# Patient Record
Sex: Male | Born: 1935 | Race: Black or African American | Hispanic: No | Marital: Single
Health system: Southern US, Community
[De-identification: ages and names within clinical notes are randomized; demographics above are authoritative.]

## PROBLEM LIST (undated history)

## (undated) DIAGNOSIS — I10 Essential (primary) hypertension: Secondary | ICD-10-CM

## (undated) DIAGNOSIS — F209 Schizophrenia, unspecified: Secondary | ICD-10-CM

## (undated) DIAGNOSIS — G309 Alzheimer's disease, unspecified: Secondary | ICD-10-CM

## (undated) DIAGNOSIS — F0280 Dementia in other diseases classified elsewhere without behavioral disturbance: Secondary | ICD-10-CM

---

## 2004-03-20 ENCOUNTER — Other Ambulatory Visit: Payer: Self-pay

## 2004-04-22 ENCOUNTER — Other Ambulatory Visit: Payer: Self-pay

## 2005-09-24 ENCOUNTER — Ambulatory Visit: Payer: Self-pay | Admitting: Radiation Oncology

## 2006-04-08 ENCOUNTER — Ambulatory Visit: Payer: Self-pay | Admitting: Radiation Oncology

## 2006-10-04 ENCOUNTER — Emergency Department: Payer: Self-pay | Admitting: Emergency Medicine

## 2006-10-04 ENCOUNTER — Other Ambulatory Visit: Payer: Self-pay

## 2007-11-07 ENCOUNTER — Emergency Department: Payer: Self-pay | Admitting: Internal Medicine

## 2008-12-05 ENCOUNTER — Ambulatory Visit: Payer: Self-pay | Admitting: Gastroenterology

## 2009-12-24 ENCOUNTER — Emergency Department: Payer: Self-pay | Admitting: Emergency Medicine

## 2010-12-18 ENCOUNTER — Emergency Department: Payer: Self-pay | Admitting: Emergency Medicine

## 2010-12-24 ENCOUNTER — Emergency Department: Payer: Self-pay | Admitting: Emergency Medicine

## 2011-01-10 ENCOUNTER — Ambulatory Visit: Payer: Self-pay | Admitting: Family Medicine

## 2011-03-17 ENCOUNTER — Emergency Department: Payer: Self-pay | Admitting: Unknown Physician Specialty

## 2011-06-29 ENCOUNTER — Ambulatory Visit: Payer: Self-pay | Admitting: Internal Medicine

## 2011-08-06 ENCOUNTER — Ambulatory Visit: Payer: Self-pay | Admitting: Internal Medicine

## 2011-08-07 LAB — PSA: PSA: 0.2 ng/mL (ref 0.0–4.0)

## 2011-08-29 ENCOUNTER — Ambulatory Visit: Payer: Self-pay | Admitting: Internal Medicine

## 2011-09-07 ENCOUNTER — Ambulatory Visit: Payer: Self-pay | Admitting: Unknown Physician Specialty

## 2011-09-14 ENCOUNTER — Ambulatory Visit: Payer: Self-pay | Admitting: Internal Medicine

## 2011-09-14 LAB — PATHOLOGY REPORT

## 2011-09-29 ENCOUNTER — Ambulatory Visit: Payer: Self-pay | Admitting: Internal Medicine

## 2011-10-01 ENCOUNTER — Emergency Department: Payer: Self-pay | Admitting: Emergency Medicine

## 2011-10-01 LAB — COMPREHENSIVE METABOLIC PANEL
Anion Gap: 7 (ref 7–16)
BUN: 22 mg/dL — ABNORMAL HIGH (ref 7–18)
Calcium, Total: 8.4 mg/dL — ABNORMAL LOW (ref 8.5–10.1)
Chloride: 106 mmol/L (ref 98–107)
Co2: 29 mmol/L (ref 21–32)
EGFR (African American): 50 — ABNORMAL LOW
EGFR (Non-African Amer.): 41 — ABNORMAL LOW
Glucose: 100 mg/dL — ABNORMAL HIGH (ref 65–99)
Potassium: 3.8 mmol/L (ref 3.5–5.1)
SGOT(AST): 28 U/L (ref 15–37)
SGPT (ALT): 24 U/L
Sodium: 142 mmol/L (ref 136–145)

## 2011-10-01 LAB — CBC
MCH: 31.8 pg (ref 26.0–34.0)
MCHC: 34.5 g/dL (ref 32.0–36.0)
Platelet: 176 10*3/uL (ref 150–440)
RBC: 3.9 10*6/uL — ABNORMAL LOW (ref 4.40–5.90)
WBC: 4.5 10*3/uL (ref 3.8–10.6)

## 2011-10-02 ENCOUNTER — Ambulatory Visit: Payer: Self-pay | Admitting: Internal Medicine

## 2011-10-14 LAB — CBC CANCER CENTER
Basophil %: 0.4 %
Eosinophil #: 0.4 x10 3/mm (ref 0.0–0.7)
HCT: 38.1 % — ABNORMAL LOW (ref 40.0–52.0)
HGB: 13 g/dL (ref 13.0–18.0)
Lymphocyte #: 1 x10 3/mm (ref 1.0–3.6)
Lymphocyte %: 13.6 %
MCH: 30.9 pg (ref 26.0–34.0)
MCV: 91 fL (ref 80–100)
Monocyte #: 0.6 x10 3/mm (ref 0.0–0.7)
Monocyte %: 8.1 %
Neutrophil %: 72.7 %
Platelet: 223 x10 3/mm (ref 150–440)
RBC: 4.2 10*6/uL — ABNORMAL LOW (ref 4.40–5.90)
WBC: 7.3 x10 3/mm (ref 3.8–10.6)

## 2011-10-15 LAB — KAPPA/LAMBDA FREE LIGHT CHAINS (ARMC)

## 2011-10-30 ENCOUNTER — Ambulatory Visit: Payer: Self-pay | Admitting: Internal Medicine

## 2012-07-08 ENCOUNTER — Emergency Department: Payer: Self-pay | Admitting: Emergency Medicine

## 2012-10-13 ENCOUNTER — Emergency Department: Payer: Self-pay | Admitting: Emergency Medicine

## 2012-10-13 LAB — COMPREHENSIVE METABOLIC PANEL
Anion Gap: 8 (ref 7–16)
Co2: 28 mmol/L (ref 21–32)
EGFR (Non-African Amer.): 37 — ABNORMAL LOW
Potassium: 4.2 mmol/L (ref 3.5–5.1)
SGOT(AST): 25 U/L (ref 15–37)
SGPT (ALT): 25 U/L (ref 12–78)
Sodium: 142 mmol/L (ref 136–145)

## 2012-10-13 LAB — URINALYSIS, COMPLETE
Bilirubin,UR: NEGATIVE
Blood: NEGATIVE
Ketone: NEGATIVE
Nitrite: NEGATIVE
Specific Gravity: 1.008 (ref 1.003–1.030)
Squamous Epithelial: NONE SEEN
WBC UR: <1 /HPF (ref 0–5)

## 2012-10-13 LAB — CBC
HCT: 35.2 % — ABNORMAL LOW (ref 40.0–52.0)
HGB: 11.7 g/dL — ABNORMAL LOW (ref 13.0–18.0)
MCV: 92 fL (ref 80–100)
Platelet: 175 10*3/uL (ref 150–440)
RBC: 3.83 10*6/uL — ABNORMAL LOW (ref 4.40–5.90)
RDW: 13.4 % (ref 11.5–14.5)
WBC: 4.2 10*3/uL (ref 3.8–10.6)

## 2012-10-13 LAB — PROTIME-INR
INR: 1.1
Prothrombin Time: 14.3 secs (ref 11.5–14.7)

## 2012-10-13 LAB — DRUG SCREEN, URINE
Barbiturates, Ur Screen: NEGATIVE (ref ?–200)
Benzodiazepine, Ur Scrn: NEGATIVE (ref ?–200)
Cannabinoid 50 Ng, Ur ~~LOC~~: NEGATIVE (ref ?–50)
Methadone, Ur Screen: NEGATIVE (ref ?–300)

## 2012-10-13 LAB — ETHANOL: Ethanol: <3 mg/dL

## 2012-10-13 LAB — TROPONIN I: Troponin-I: <0.02 ng/mL

## 2013-02-04 ENCOUNTER — Emergency Department: Payer: Self-pay | Admitting: Emergency Medicine

## 2014-09-07 ENCOUNTER — Inpatient Hospital Stay: Payer: Self-pay | Admitting: Internal Medicine

## 2014-09-07 LAB — COMPREHENSIVE METABOLIC PANEL
ALBUMIN: 3.4 g/dL (ref 3.4–5.0)
ALT: 28 U/L
Alkaline Phosphatase: 103 U/L
Anion Gap: 5 — ABNORMAL LOW (ref 7–16)
BUN: 30 mg/dL — ABNORMAL HIGH (ref 7–18)
Bilirubin,Total: 0.4 mg/dL (ref 0.2–1.0)
CHLORIDE: 110 mmol/L — AB (ref 98–107)
Calcium, Total: 9.2 mg/dL (ref 8.5–10.1)
Co2: 28 mmol/L (ref 21–32)
Creatinine: 2.23 mg/dL — ABNORMAL HIGH (ref 0.60–1.30)
EGFR (African American): 37 — ABNORMAL LOW
EGFR (Non-African Amer.): 30 — ABNORMAL LOW
Glucose: 127 mg/dL — ABNORMAL HIGH (ref 65–99)
Osmolality: 293 (ref 275–301)
Potassium: 5.4 mmol/L — ABNORMAL HIGH (ref 3.5–5.1)
SGOT(AST): 29 U/L (ref 15–37)
Sodium: 143 mmol/L (ref 136–145)
Total Protein: 7.4 g/dL (ref 6.4–8.2)

## 2014-09-07 LAB — CBC
HCT: 41.8 % (ref 40.0–52.0)
HGB: 13.6 g/dL (ref 13.0–18.0)
MCH: 30.1 pg (ref 26.0–34.0)
MCHC: 32.4 g/dL (ref 32.0–36.0)
MCV: 93 fL (ref 80–100)
Platelet: 255 10*3/uL (ref 150–440)
RBC: 4.51 10*6/uL (ref 4.40–5.90)
RDW: 14.3 % (ref 11.5–14.5)
WBC: 7 10*3/uL (ref 3.8–10.6)

## 2014-09-07 LAB — URINALYSIS, COMPLETE
Bilirubin,UR: NEGATIVE
Blood: NEGATIVE
Glucose,UR: NEGATIVE mg/dL (ref 0–75)
Hyaline Cast: 3
Ketone: NEGATIVE
Nitrite: NEGATIVE
Ph: 5 (ref 4.5–8.0)
Protein: NEGATIVE
SPECIFIC GRAVITY: 1.015 (ref 1.003–1.030)
WBC UR: 12 /HPF (ref 0–5)

## 2014-09-07 LAB — TROPONIN I: Troponin-I: <0.02 ng/mL

## 2014-09-08 LAB — CBC WITH DIFFERENTIAL/PLATELET
BASOS ABS: 0 10*3/uL (ref 0.0–0.1)
BASOS PCT: 0.7 %
EOS ABS: 0.1 10*3/uL (ref 0.0–0.7)
Eosinophil %: 0.7 %
HCT: 40.6 % (ref 40.0–52.0)
HGB: 13.2 g/dL (ref 13.0–18.0)
Lymphocyte #: 0.6 10*3/uL — ABNORMAL LOW (ref 1.0–3.6)
Lymphocyte %: 8 %
MCH: 30 pg (ref 26.0–34.0)
MCHC: 32.6 g/dL (ref 32.0–36.0)
MCV: 92 fL (ref 80–100)
MONO ABS: 0.8 x10 3/mm (ref 0.2–1.0)
Monocyte %: 10.5 %
Neutrophil #: 6 10*3/uL (ref 1.4–6.5)
Neutrophil %: 80.1 %
PLATELETS: 252 10*3/uL (ref 150–440)
RBC: 4.4 10*6/uL (ref 4.40–5.90)
RDW: 14 % (ref 11.5–14.5)
WBC: 7.5 10*3/uL (ref 3.8–10.6)

## 2014-09-08 LAB — BASIC METABOLIC PANEL
Anion Gap: 7 (ref 7–16)
BUN: 26 mg/dL — ABNORMAL HIGH (ref 7–18)
Calcium, Total: 9.2 mg/dL (ref 8.5–10.1)
Chloride: 110 mmol/L — ABNORMAL HIGH (ref 98–107)
Co2: 25 mmol/L (ref 21–32)
Creatinine: 1.84 mg/dL — ABNORMAL HIGH (ref 0.60–1.30)
EGFR (African American): 46 — ABNORMAL LOW
GFR CALC NON AF AMER: 38 — AB
Glucose: 104 mg/dL — ABNORMAL HIGH (ref 65–99)
Osmolality: 288 (ref 275–301)
Potassium: 4.6 mmol/L (ref 3.5–5.1)
SODIUM: 142 mmol/L (ref 136–145)

## 2014-09-09 LAB — CBC WITH DIFFERENTIAL/PLATELET
BASOS ABS: 0.1 10*3/uL (ref 0.0–0.1)
Basophil %: 0.7 %
EOS ABS: 0 10*3/uL (ref 0.0–0.7)
EOS PCT: 0.2 %
HCT: 43.1 % (ref 40.0–52.0)
HGB: 13.7 g/dL (ref 13.0–18.0)
LYMPHS PCT: 6.5 %
Lymphocyte #: 0.7 10*3/uL — ABNORMAL LOW (ref 1.0–3.6)
MCH: 29.6 pg (ref 26.0–34.0)
MCHC: 31.7 g/dL — ABNORMAL LOW (ref 32.0–36.0)
MCV: 93 fL (ref 80–100)
Monocyte #: 0.9 x10 3/mm (ref 0.2–1.0)
Monocyte %: 7.9 %
Neutrophil #: 9.6 10*3/uL — ABNORMAL HIGH (ref 1.4–6.5)
Neutrophil %: 84.7 %
Platelet: 266 10*3/uL (ref 150–440)
RBC: 4.61 10*6/uL (ref 4.40–5.90)
RDW: 13.7 % (ref 11.5–14.5)
WBC: 11.3 10*3/uL — AB (ref 3.8–10.6)

## 2014-09-09 LAB — BASIC METABOLIC PANEL
Anion Gap: 9 (ref 7–16)
BUN: 46 mg/dL — ABNORMAL HIGH (ref 7–18)
CALCIUM: 9.8 mg/dL (ref 8.5–10.1)
CO2: 27 mmol/L (ref 21–32)
CREATININE: 2.39 mg/dL — AB (ref 0.60–1.30)
Chloride: 106 mmol/L (ref 98–107)
EGFR (Non-African Amer.): 28 — ABNORMAL LOW
GFR CALC AF AMER: 34 — AB
Glucose: 153 mg/dL — ABNORMAL HIGH (ref 65–99)
Osmolality: 298 (ref 275–301)
Potassium: 5.3 mmol/L — ABNORMAL HIGH (ref 3.5–5.1)
Sodium: 142 mmol/L (ref 136–145)

## 2014-09-11 LAB — BASIC METABOLIC PANEL
ANION GAP: 9 (ref 7–16)
BUN: 35 mg/dL — ABNORMAL HIGH (ref 7–18)
CALCIUM: 8.6 mg/dL (ref 8.5–10.1)
CHLORIDE: 106 mmol/L (ref 98–107)
Co2: 27 mmol/L (ref 21–32)
Creatinine: 1.91 mg/dL — ABNORMAL HIGH (ref 0.60–1.30)
EGFR (African American): 44 — ABNORMAL LOW
EGFR (Non-African Amer.): 36 — ABNORMAL LOW
Glucose: 147 mg/dL — ABNORMAL HIGH (ref 65–99)
Osmolality: 294 (ref 275–301)
Potassium: 3.7 mmol/L (ref 3.5–5.1)
SODIUM: 142 mmol/L (ref 136–145)

## 2014-09-11 LAB — CBC WITH DIFFERENTIAL/PLATELET
BASOS ABS: 0.1 10*3/uL (ref 0.0–0.1)
Basophil %: 0.7 %
Eosinophil #: 0.2 10*3/uL (ref 0.0–0.7)
Eosinophil %: 2.3 %
HCT: 40 % (ref 40.0–52.0)
HGB: 12.8 g/dL — AB (ref 13.0–18.0)
LYMPHS ABS: 0.9 10*3/uL — AB (ref 1.0–3.6)
Lymphocyte %: 12.6 %
MCH: 29.8 pg (ref 26.0–34.0)
MCHC: 31.9 g/dL — AB (ref 32.0–36.0)
MCV: 94 fL (ref 80–100)
MONOS PCT: 11.5 %
Monocyte #: 0.8 x10 3/mm (ref 0.2–1.0)
Neutrophil #: 5.1 10*3/uL (ref 1.4–6.5)
Neutrophil %: 72.9 %
Platelet: 210 10*3/uL (ref 150–440)
RBC: 4.28 10*6/uL — ABNORMAL LOW (ref 4.40–5.90)
RDW: 14.1 % (ref 11.5–14.5)
WBC: 7 10*3/uL (ref 3.8–10.6)

## 2014-09-14 ENCOUNTER — Emergency Department: Payer: Self-pay | Admitting: Emergency Medicine

## 2014-09-14 LAB — COMPREHENSIVE METABOLIC PANEL
Albumin: 3 g/dL — ABNORMAL LOW (ref 3.4–5.0)
Alkaline Phosphatase: 98 U/L
Anion Gap: 7 (ref 7–16)
BUN: 28 mg/dL — ABNORMAL HIGH (ref 7–18)
Bilirubin,Total: 0.5 mg/dL (ref 0.2–1.0)
CHLORIDE: 111 mmol/L — AB (ref 98–107)
Calcium, Total: 8.5 mg/dL (ref 8.5–10.1)
Co2: 25 mmol/L (ref 21–32)
Creatinine: 1.99 mg/dL — ABNORMAL HIGH (ref 0.60–1.30)
EGFR (Non-African Amer.): 35 — ABNORMAL LOW
GFR CALC AF AMER: 42 — AB
GLUCOSE: 77 mg/dL (ref 65–99)
Osmolality: 289 (ref 275–301)
POTASSIUM: 3.8 mmol/L (ref 3.5–5.1)
SGOT(AST): 34 U/L (ref 15–37)
SGPT (ALT): 36 U/L
SODIUM: 143 mmol/L (ref 136–145)
Total Protein: 6.7 g/dL (ref 6.4–8.2)

## 2014-09-14 LAB — CBC
HCT: 37.8 % — AB (ref 40.0–52.0)
HGB: 12.1 g/dL — ABNORMAL LOW (ref 13.0–18.0)
MCH: 29.7 pg (ref 26.0–34.0)
MCHC: 32 g/dL (ref 32.0–36.0)
MCV: 93 fL (ref 80–100)
Platelet: 197 10*3/uL (ref 150–440)
RBC: 4.07 10*6/uL — ABNORMAL LOW (ref 4.40–5.90)
RDW: 13.8 % (ref 11.5–14.5)
WBC: 7.3 10*3/uL (ref 3.8–10.6)

## 2014-09-14 LAB — ETHANOL: Ethanol: <3 mg/dL

## 2014-09-14 LAB — DRUG SCREEN, URINE
AMPHETAMINES, UR SCREEN: NEGATIVE (ref ?–1000)
BARBITURATES, UR SCREEN: NEGATIVE (ref ?–200)
BENZODIAZEPINE, UR SCRN: NEGATIVE (ref ?–200)
COCAINE METABOLITE, UR ~~LOC~~: NEGATIVE (ref ?–300)
Cannabinoid 50 Ng, Ur ~~LOC~~: NEGATIVE (ref ?–50)
MDMA (ECSTASY) UR SCREEN: NEGATIVE (ref ?–500)
METHADONE, UR SCREEN: NEGATIVE (ref ?–300)
OPIATE, UR SCREEN: NEGATIVE (ref ?–300)
Phencyclidine (PCP) Ur S: NEGATIVE (ref ?–25)
TRICYCLIC, UR SCREEN: NEGATIVE (ref ?–1000)

## 2014-09-14 LAB — URINALYSIS, COMPLETE
BILIRUBIN, UR: NEGATIVE
BLOOD: NEGATIVE
Glucose,UR: NEGATIVE mg/dL (ref 0–75)
Ketone: NEGATIVE
LEUKOCYTE ESTERASE: NEGATIVE
NITRITE: NEGATIVE
PH: 5 (ref 4.5–8.0)
PROTEIN: NEGATIVE
RBC, UR: NONE SEEN /HPF (ref 0–5)
Specific Gravity: 1.006 (ref 1.003–1.030)
Squamous Epithelial: <1
WBC UR: 1 /HPF (ref 0–5)

## 2014-09-14 LAB — SALICYLATE LEVEL: Salicylates, Serum: <1.7 mg/dL

## 2014-09-14 LAB — TSH: THYROID STIMULATING HORM: 0.466 u[IU]/mL

## 2014-09-14 LAB — ACETAMINOPHEN LEVEL: Acetaminophen: <2 ug/mL

## 2014-09-23 ENCOUNTER — Emergency Department: Payer: Self-pay | Admitting: Emergency Medicine

## 2014-09-23 LAB — URINALYSIS, COMPLETE
Bacteria: NONE SEEN
Bilirubin,UR: NEGATIVE
Blood: NEGATIVE
GLUCOSE, UR: NEGATIVE mg/dL (ref 0–75)
Ketone: NEGATIVE
Leukocyte Esterase: NEGATIVE
Nitrite: NEGATIVE
PH: 6 (ref 4.5–8.0)
PROTEIN: NEGATIVE
RBC,UR: 1 /HPF (ref 0–5)
SPECIFIC GRAVITY: 1.008 (ref 1.003–1.030)
Squamous Epithelial: <1
WBC UR: 2 /HPF (ref 0–5)

## 2014-09-23 LAB — CBC
HCT: 37.5 % — AB (ref 40.0–52.0)
HGB: 12 g/dL — AB (ref 13.0–18.0)
MCH: 29.4 pg (ref 26.0–34.0)
MCHC: 32 g/dL (ref 32.0–36.0)
MCV: 92 fL (ref 80–100)
PLATELETS: 232 10*3/uL (ref 150–440)
RBC: 4.09 10*6/uL — AB (ref 4.40–5.90)
RDW: 13.9 % (ref 11.5–14.5)
WBC: 6.7 10*3/uL (ref 3.8–10.6)

## 2014-09-23 LAB — COMPREHENSIVE METABOLIC PANEL
ALT: 28 U/L
AST: 30 U/L (ref 15–37)
Albumin: 3.1 g/dL — ABNORMAL LOW (ref 3.4–5.0)
Alkaline Phosphatase: 107 U/L
Anion Gap: 6 — ABNORMAL LOW (ref 7–16)
BUN: 23 mg/dL — ABNORMAL HIGH (ref 7–18)
Bilirubin,Total: 0.4 mg/dL (ref 0.2–1.0)
CO2: 27 mmol/L (ref 21–32)
Calcium, Total: 8.8 mg/dL (ref 8.5–10.1)
Chloride: 111 mmol/L — ABNORMAL HIGH (ref 98–107)
Creatinine: 1.98 mg/dL — ABNORMAL HIGH (ref 0.60–1.30)
EGFR (African American): 42 — ABNORMAL LOW
EGFR (Non-African Amer.): 35 — ABNORMAL LOW
Glucose: 78 mg/dL (ref 65–99)
Osmolality: 289 (ref 275–301)
POTASSIUM: 4.2 mmol/L (ref 3.5–5.1)
SODIUM: 144 mmol/L (ref 136–145)
TOTAL PROTEIN: 6.9 g/dL (ref 6.4–8.2)

## 2014-09-23 LAB — TROPONIN I

## 2014-09-23 LAB — ETHANOL

## 2014-09-24 LAB — CBC WITH DIFFERENTIAL/PLATELET
Basophil #: 0.1 10*3/uL (ref 0.0–0.1)
Basophil %: 0.9 %
EOS ABS: 0.1 10*3/uL (ref 0.0–0.7)
Eosinophil %: 1.4 %
HCT: 38.8 % — AB (ref 40.0–52.0)
HGB: 12.5 g/dL — ABNORMAL LOW (ref 13.0–18.0)
LYMPHS ABS: 1 10*3/uL (ref 1.0–3.6)
Lymphocyte %: 11.8 %
MCH: 29.5 pg (ref 26.0–34.0)
MCHC: 32.1 g/dL (ref 32.0–36.0)
MCV: 92 fL (ref 80–100)
Monocyte #: 0.9 x10 3/mm (ref 0.2–1.0)
Monocyte %: 10.2 %
Neutrophil #: 6.4 10*3/uL (ref 1.4–6.5)
Neutrophil %: 75.7 %
PLATELETS: 242 10*3/uL (ref 150–440)
RBC: 4.22 10*6/uL — ABNORMAL LOW (ref 4.40–5.90)
RDW: 13.9 % (ref 11.5–14.5)
WBC: 8.5 10*3/uL (ref 3.8–10.6)

## 2014-09-24 LAB — BASIC METABOLIC PANEL
ANION GAP: 7 (ref 7–16)
BUN: 26 mg/dL — ABNORMAL HIGH (ref 7–18)
CHLORIDE: 111 mmol/L — AB (ref 98–107)
CREATININE: 1.76 mg/dL — AB (ref 0.60–1.30)
Calcium, Total: 9.2 mg/dL (ref 8.5–10.1)
Co2: 27 mmol/L (ref 21–32)
EGFR (Non-African Amer.): 40 — ABNORMAL LOW
GFR CALC AF AMER: 48 — AB
GLUCOSE: 77 mg/dL (ref 65–99)
OSMOLALITY: 292 (ref 275–301)
Potassium: 4.4 mmol/L (ref 3.5–5.1)
Sodium: 145 mmol/L (ref 136–145)

## 2014-10-01 ENCOUNTER — Emergency Department: Payer: Self-pay | Admitting: Emergency Medicine

## 2014-10-01 LAB — DRUG SCREEN, URINE
Amphetamines, Ur Screen: NEGATIVE (ref ?–1000)
Barbiturates, Ur Screen: NEGATIVE (ref ?–200)
Benzodiazepine, Ur Scrn: NEGATIVE (ref ?–200)
Cannabinoid 50 Ng, Ur ~~LOC~~: NEGATIVE (ref ?–50)
Cocaine Metabolite,Ur ~~LOC~~: NEGATIVE (ref ?–300)
MDMA (ECSTASY) UR SCREEN: NEGATIVE (ref ?–500)
Methadone, Ur Screen: NEGATIVE (ref ?–300)
Opiate, Ur Screen: NEGATIVE (ref ?–300)
PHENCYCLIDINE (PCP) UR S: NEGATIVE (ref ?–25)
Tricyclic, Ur Screen: NEGATIVE (ref ?–1000)

## 2014-10-01 LAB — URINALYSIS, COMPLETE
Bacteria: NONE SEEN
Bilirubin,UR: NEGATIVE
Blood: NEGATIVE
Glucose,UR: NEGATIVE mg/dL (ref 0–75)
Ketone: NEGATIVE
LEUKOCYTE ESTERASE: NEGATIVE
Nitrite: NEGATIVE
PROTEIN: NEGATIVE
Ph: 5 (ref 4.5–8.0)
RBC,UR: 1 /HPF (ref 0–5)
Specific Gravity: 1.009 (ref 1.003–1.030)
Squamous Epithelial: NONE SEEN
WBC UR: 1 /HPF (ref 0–5)

## 2014-10-01 LAB — COMPREHENSIVE METABOLIC PANEL
ALBUMIN: 3.5 g/dL (ref 3.4–5.0)
ANION GAP: 6 — AB (ref 7–16)
AST: 31 U/L (ref 15–37)
Alkaline Phosphatase: 114 U/L
BUN: 26 mg/dL — AB (ref 7–18)
Bilirubin,Total: 0.4 mg/dL (ref 0.2–1.0)
Calcium, Total: 8.9 mg/dL (ref 8.5–10.1)
Chloride: 107 mmol/L (ref 98–107)
Co2: 27 mmol/L (ref 21–32)
Creatinine: 1.8 mg/dL — ABNORMAL HIGH (ref 0.60–1.30)
EGFR (African American): 47 — ABNORMAL LOW
EGFR (Non-African Amer.): 39 — ABNORMAL LOW
GLUCOSE: 112 mg/dL — AB (ref 65–99)
Osmolality: 285 (ref 275–301)
Potassium: 4.5 mmol/L (ref 3.5–5.1)
SGPT (ALT): 34 U/L
Sodium: 140 mmol/L (ref 136–145)
TOTAL PROTEIN: 7.5 g/dL (ref 6.4–8.2)

## 2014-10-01 LAB — ETHANOL

## 2014-10-01 LAB — CBC
HCT: 41 % (ref 40.0–52.0)
HGB: 13.3 g/dL (ref 13.0–18.0)
MCH: 30 pg (ref 26.0–34.0)
MCHC: 32.4 g/dL (ref 32.0–36.0)
MCV: 93 fL (ref 80–100)
Platelet: 241 10*3/uL (ref 150–440)
RBC: 4.42 10*6/uL (ref 4.40–5.90)
RDW: 14.2 % (ref 11.5–14.5)
WBC: 6.8 10*3/uL (ref 3.8–10.6)

## 2014-10-01 LAB — SALICYLATE LEVEL

## 2014-10-01 LAB — TROPONIN I: Troponin-I: 0.03 ng/mL

## 2014-10-01 LAB — ACETAMINOPHEN LEVEL: Acetaminophen: <2 ug/mL

## 2014-10-06 ENCOUNTER — Emergency Department: Payer: Self-pay | Admitting: Emergency Medicine

## 2014-10-06 LAB — DRUG SCREEN, URINE
Amphetamines, Ur Screen: NEGATIVE (ref ?–1000)
BENZODIAZEPINE, UR SCRN: NEGATIVE (ref ?–200)
Barbiturates, Ur Screen: NEGATIVE (ref ?–200)
CANNABINOID 50 NG, UR ~~LOC~~: NEGATIVE (ref ?–50)
COCAINE METABOLITE, UR ~~LOC~~: NEGATIVE (ref ?–300)
MDMA (ECSTASY) UR SCREEN: NEGATIVE (ref ?–500)
Methadone, Ur Screen: NEGATIVE (ref ?–300)
OPIATE, UR SCREEN: NEGATIVE (ref ?–300)
PHENCYCLIDINE (PCP) UR S: NEGATIVE (ref ?–25)
Tricyclic, Ur Screen: NEGATIVE (ref ?–1000)

## 2014-10-06 LAB — URINALYSIS, COMPLETE
Bacteria: NONE SEEN
Bilirubin,UR: NEGATIVE
Blood: NEGATIVE
Glucose,UR: NEGATIVE mg/dL (ref 0–75)
Leukocyte Esterase: NEGATIVE
NITRITE: NEGATIVE
PROTEIN: NEGATIVE
Ph: 5 (ref 4.5–8.0)
RBC,UR: 1 /HPF (ref 0–5)
Specific Gravity: 1.009 (ref 1.003–1.030)
Squamous Epithelial: NONE SEEN
WBC UR: NONE SEEN /HPF (ref 0–5)

## 2014-10-06 LAB — COMPREHENSIVE METABOLIC PANEL
Albumin: 3.6 g/dL (ref 3.4–5.0)
Alkaline Phosphatase: 113 U/L
Anion Gap: 9 (ref 7–16)
BUN: 34 mg/dL — ABNORMAL HIGH (ref 7–18)
Bilirubin,Total: 0.7 mg/dL (ref 0.2–1.0)
Calcium, Total: 9.8 mg/dL (ref 8.5–10.1)
Chloride: 109 mmol/L — ABNORMAL HIGH (ref 98–107)
Co2: 28 mmol/L (ref 21–32)
Creatinine: 1.99 mg/dL — ABNORMAL HIGH (ref 0.60–1.30)
EGFR (African American): 42 — ABNORMAL LOW
EGFR (Non-African Amer.): 35 — ABNORMAL LOW
Glucose: 101 mg/dL — ABNORMAL HIGH (ref 65–99)
Osmolality: 298 (ref 275–301)
Potassium: 5.4 mmol/L — ABNORMAL HIGH (ref 3.5–5.1)
SGOT(AST): 27 U/L (ref 15–37)
SGPT (ALT): 33 U/L
Sodium: 146 mmol/L — ABNORMAL HIGH (ref 136–145)
Total Protein: 7.5 g/dL (ref 6.4–8.2)

## 2014-10-06 LAB — CBC
HCT: 44.7 % (ref 40.0–52.0)
HGB: 14.3 g/dL (ref 13.0–18.0)
MCH: 29.7 pg (ref 26.0–34.0)
MCHC: 32 g/dL (ref 32.0–36.0)
MCV: 93 fL (ref 80–100)
Platelet: 295 10*3/uL (ref 150–440)
RBC: 4.82 10*6/uL (ref 4.40–5.90)
RDW: 14.1 % (ref 11.5–14.5)
WBC: 8.4 10*3/uL (ref 3.8–10.6)

## 2014-10-06 LAB — ETHANOL: Ethanol: <3 mg/dL

## 2014-10-06 LAB — ACETAMINOPHEN LEVEL: Acetaminophen: <2 ug/mL

## 2014-10-06 LAB — TSH: Thyroid Stimulating Horm: 0.655 u[IU]/mL

## 2014-10-06 LAB — SALICYLATE LEVEL: Salicylates, Serum: <1.7 mg/dL

## 2014-10-07 LAB — BASIC METABOLIC PANEL
Anion Gap: 7 (ref 7–16)
BUN: 32 mg/dL — ABNORMAL HIGH (ref 7–18)
CALCIUM: 8.9 mg/dL (ref 8.5–10.1)
CO2: 28 mmol/L (ref 21–32)
Chloride: 105 mmol/L (ref 98–107)
Creatinine: 1.84 mg/dL — ABNORMAL HIGH (ref 0.60–1.30)
EGFR (African American): 46 — ABNORMAL LOW
EGFR (Non-African Amer.): 38 — ABNORMAL LOW
Glucose: 154 mg/dL — ABNORMAL HIGH (ref 65–99)
OSMOLALITY: 289 (ref 275–301)
POTASSIUM: 4.1 mmol/L (ref 3.5–5.1)
SODIUM: 140 mmol/L (ref 136–145)

## 2014-10-10 ENCOUNTER — Emergency Department: Payer: Self-pay | Admitting: Emergency Medicine

## 2014-10-10 LAB — CBC
HCT: 39.2 % — ABNORMAL LOW
HGB: 12.7 g/dL — ABNORMAL LOW
MCH: 29.6 pg
MCHC: 32.5 g/dL
MCV: 91 fL
Platelet: 230 x10 3/mm 3
RBC: 4.31 x10 6/mm 3 — ABNORMAL LOW
RDW: 14.1 %
WBC: 7.6 x10 3/mm 3

## 2014-10-10 LAB — COMPREHENSIVE METABOLIC PANEL WITH GFR
Albumin: 3.2 g/dL — ABNORMAL LOW
Alkaline Phosphatase: 97 U/L
Anion Gap: 8
BUN: 29 mg/dL — ABNORMAL HIGH
Bilirubin,Total: 0.5 mg/dL
Calcium, Total: 8.9 mg/dL
Chloride: 107 mmol/L
Co2: 27 mmol/L
Creatinine: 1.97 mg/dL — ABNORMAL HIGH
EGFR (African American): 42 — ABNORMAL LOW
EGFR (Non-African Amer.): 35 — ABNORMAL LOW
Glucose: 67 mg/dL
Osmolality: 287
Potassium: 4.4 mmol/L
SGOT(AST): 39 U/L — ABNORMAL HIGH
SGPT (ALT): 32 U/L
Sodium: 142 mmol/L
Total Protein: 6.7 g/dL

## 2014-10-10 LAB — ETHANOL: Ethanol: <3 mg/dL

## 2014-10-10 LAB — TSH: Thyroid Stimulating Horm: 0.728 u[IU]/mL

## 2014-10-10 LAB — ACETAMINOPHEN LEVEL: Acetaminophen: <2 ug/mL

## 2014-10-10 LAB — SALICYLATE LEVEL: Salicylates, Serum: <1.7 mg/dL

## 2014-10-14 LAB — URINALYSIS, COMPLETE
Bacteria: NONE SEEN
Bilirubin,UR: NEGATIVE
Blood: NEGATIVE
Glucose,UR: NEGATIVE mg/dL (ref 0–75)
KETONE: NEGATIVE
LEUKOCYTE ESTERASE: NEGATIVE
Nitrite: NEGATIVE
PH: 6 (ref 4.5–8.0)
Protein: NEGATIVE
Specific Gravity: 1.01 (ref 1.003–1.030)
Squamous Epithelial: NONE SEEN

## 2014-10-14 LAB — DRUG SCREEN, URINE

## 2014-10-20 ENCOUNTER — Emergency Department: Payer: Self-pay | Admitting: Emergency Medicine

## 2014-10-20 LAB — COMPREHENSIVE METABOLIC PANEL
ALBUMIN: 3.4 g/dL (ref 3.4–5.0)
AST: 44 U/L — AB (ref 15–37)
Alkaline Phosphatase: 122 U/L — ABNORMAL HIGH
Anion Gap: 8 (ref 7–16)
BUN: 25 mg/dL — AB (ref 7–18)
Bilirubin,Total: 0.5 mg/dL (ref 0.2–1.0)
CALCIUM: 8.9 mg/dL (ref 8.5–10.1)
CREATININE: 1.81 mg/dL — AB (ref 0.60–1.30)
Chloride: 107 mmol/L (ref 98–107)
Co2: 28 mmol/L (ref 21–32)
EGFR (African American): 47 — ABNORMAL LOW
EGFR (Non-African Amer.): 39 — ABNORMAL LOW
Glucose: 109 mg/dL — ABNORMAL HIGH (ref 65–99)
Osmolality: 290 (ref 275–301)
Potassium: 4.4 mmol/L (ref 3.5–5.1)
SGPT (ALT): 41 U/L
Sodium: 143 mmol/L (ref 136–145)
Total Protein: 7.4 g/dL (ref 6.4–8.2)

## 2014-10-20 LAB — TROPONIN I: Troponin-I: 0.03 ng/mL

## 2014-10-20 LAB — URINALYSIS, COMPLETE
BACTERIA: NONE SEEN
BILIRUBIN, UR: NEGATIVE
Blood: NEGATIVE
GLUCOSE, UR: NEGATIVE mg/dL (ref 0–75)
Ketone: NEGATIVE
Leukocyte Esterase: NEGATIVE
Nitrite: NEGATIVE
Ph: 6 (ref 4.5–8.0)
Protein: NEGATIVE
SQUAMOUS EPITHELIAL: NONE SEEN
Specific Gravity: 1.008 (ref 1.003–1.030)
WBC UR: 1 /HPF (ref 0–5)

## 2014-10-20 LAB — CBC
HCT: 39.1 % — ABNORMAL LOW (ref 40.0–52.0)
HGB: 12.8 g/dL — AB (ref 13.0–18.0)
MCH: 30 pg (ref 26.0–34.0)
MCHC: 32.6 g/dL (ref 32.0–36.0)
MCV: 92 fL (ref 80–100)
PLATELETS: 220 10*3/uL (ref 150–440)
RBC: 4.25 10*6/uL — ABNORMAL LOW (ref 4.40–5.90)
RDW: 13.9 % (ref 11.5–14.5)
WBC: 6.6 10*3/uL (ref 3.8–10.6)

## 2014-10-21 ENCOUNTER — Emergency Department: Payer: Self-pay | Admitting: Emergency Medicine

## 2014-10-25 LAB — URINALYSIS, COMPLETE
BILIRUBIN, UR: NEGATIVE
Bacteria: NONE SEEN
Glucose,UR: NEGATIVE mg/dL (ref 0–75)
KETONE: NEGATIVE
Leukocyte Esterase: NEGATIVE
Nitrite: NEGATIVE
Ph: 5 (ref 4.5–8.0)
Protein: NEGATIVE
Specific Gravity: 1.011 (ref 1.003–1.030)
Squamous Epithelial: NONE SEEN

## 2014-10-25 LAB — COMPREHENSIVE METABOLIC PANEL
ALK PHOS: 101 U/L (ref 46–116)
Albumin: 3 g/dL — ABNORMAL LOW (ref 3.4–5.0)
Anion Gap: 10 (ref 7–16)
BILIRUBIN TOTAL: 0.4 mg/dL (ref 0.2–1.0)
BUN: 45 mg/dL — ABNORMAL HIGH (ref 7–18)
CO2: 26 mmol/L (ref 21–32)
Calcium, Total: 9.4 mg/dL (ref 8.5–10.1)
Chloride: 106 mmol/L (ref 98–107)
Creatinine: 2.17 mg/dL — ABNORMAL HIGH (ref 0.60–1.30)
EGFR (African American): 38 — ABNORMAL LOW
GFR CALC NON AF AMER: 31 — AB
GLUCOSE: 116 mg/dL — AB (ref 65–99)
OSMOLALITY: 296 (ref 275–301)
POTASSIUM: 4.5 mmol/L (ref 3.5–5.1)
SGOT(AST): 35 U/L (ref 15–37)
SGPT (ALT): 35 U/L (ref 14–63)
Sodium: 142 mmol/L (ref 136–145)
Total Protein: 7.5 g/dL (ref 6.4–8.2)

## 2014-10-25 LAB — CBC
HCT: 42 % (ref 40.0–52.0)
HGB: 13.4 g/dL (ref 13.0–18.0)
MCH: 29.1 pg (ref 26.0–34.0)
MCHC: 31.9 g/dL — ABNORMAL LOW (ref 32.0–36.0)
MCV: 91 fL (ref 80–100)
PLATELETS: 297 10*3/uL (ref 150–440)
RBC: 4.61 10*6/uL (ref 4.40–5.90)
RDW: 13.7 % (ref 11.5–14.5)
WBC: 10 10*3/uL (ref 3.8–10.6)

## 2014-10-25 LAB — TROPONIN I: Troponin-I: 0.03 ng/mL

## 2014-10-26 LAB — BASIC METABOLIC PANEL
ANION GAP: 6 — AB (ref 7–16)
BUN: 34 mg/dL — ABNORMAL HIGH (ref 7–18)
CALCIUM: 9.4 mg/dL (ref 8.5–10.1)
Chloride: 108 mmol/L — ABNORMAL HIGH (ref 98–107)
Co2: 29 mmol/L (ref 21–32)
Creatinine: 1.94 mg/dL — ABNORMAL HIGH (ref 0.60–1.30)
EGFR (Non-African Amer.): 36 — ABNORMAL LOW
GFR CALC AF AMER: 43 — AB
Glucose: 168 mg/dL — ABNORMAL HIGH (ref 65–99)
Osmolality: 296 (ref 275–301)
Potassium: 4.5 mmol/L (ref 3.5–5.1)
Sodium: 143 mmol/L (ref 136–145)

## 2014-12-19 ENCOUNTER — Emergency Department: Payer: Self-pay | Admitting: Emergency Medicine

## 2014-12-19 LAB — COMPREHENSIVE METABOLIC PANEL
ANION GAP: 9 (ref 7–16)
Albumin: 3.9 g/dL
Alkaline Phosphatase: 102 U/L
BUN: 30 mg/dL — ABNORMAL HIGH
Bilirubin,Total: 0.3 mg/dL
CALCIUM: 8.9 mg/dL
CO2: 26 mmol/L
Chloride: 104 mmol/L
Creatinine: 2.03 mg/dL — ABNORMAL HIGH
GFR CALC AF AMER: 35 — AB
GFR CALC NON AF AMER: 30 — AB
Glucose: 124 mg/dL — ABNORMAL HIGH
Potassium: 3.7 mmol/L
SGOT(AST): 24 U/L
SGPT (ALT): 19 U/L
Sodium: 139 mmol/L
Total Protein: 7 g/dL

## 2014-12-19 LAB — CBC
HCT: 38.2 % — ABNORMAL LOW
HGB: 12.4 g/dL — ABNORMAL LOW
MCH: 29.2 pg
MCHC: 32.4 g/dL
MCV: 90 fL
Platelet: 223 x10 3/mm 3
RBC: 4.24 x10 6/mm 3 — ABNORMAL LOW
RDW: 14.5 %
WBC: 7.7 x10 3/mm 3

## 2015-01-15 ENCOUNTER — Emergency Department
Admit: 2015-01-15 | Disposition: A | Discharge status: Home / Self Care | Payer: Self-pay | Admitting: Emergency Medicine

## 2015-01-15 LAB — COMPREHENSIVE METABOLIC PANEL
ALT: 20 U/L
Albumin: 3.6 g/dL
Alkaline Phosphatase: 93 U/L
Anion Gap: 5 — ABNORMAL LOW (ref 7–16)
BUN: 25 mg/dL — AB
Bilirubin,Total: 0.5 mg/dL
CHLORIDE: 108 mmol/L
Calcium, Total: 8 mg/dL — ABNORMAL LOW
Co2: 26 mmol/L
Creatinine: 1.58 mg/dL — ABNORMAL HIGH
GFR CALC AF AMER: 47 — AB
GFR CALC NON AF AMER: 41 — AB
GLUCOSE: 66 mg/dL
Potassium: 4.1 mmol/L
SGOT(AST): 30 U/L
SODIUM: 139 mmol/L
TOTAL PROTEIN: 7 g/dL

## 2015-01-15 LAB — CBC
HCT: 39.6 % — ABNORMAL LOW (ref 40.0–52.0)
HGB: 13.3 g/dL (ref 13.0–18.0)
MCH: 29.8 pg (ref 26.0–34.0)
MCHC: 33.6 g/dL (ref 32.0–36.0)
MCV: 89 fL (ref 80–100)
PLATELETS: 202 10*3/uL (ref 150–440)
RBC: 4.46 10*6/uL (ref 4.40–5.90)
RDW: 14.1 % (ref 11.5–14.5)
WBC: 11.2 10*3/uL — AB (ref 3.8–10.6)

## 2015-01-15 LAB — DRUG SCREEN, URINE
Amphetamines, Ur Screen: NEGATIVE
BARBITURATES, UR SCREEN: NEGATIVE
BENZODIAZEPINE, UR SCRN: NEGATIVE
COCAINE METABOLITE, UR ~~LOC~~: NEGATIVE
Cannabinoid 50 Ng, Ur ~~LOC~~: NEGATIVE
MDMA (Ecstasy)Ur Screen: NEGATIVE
Methadone, Ur Screen: NEGATIVE
OPIATE, UR SCREEN: NEGATIVE
Phencyclidine (PCP) Ur S: NEGATIVE
TRICYCLIC, UR SCREEN: NEGATIVE

## 2015-01-15 LAB — URINALYSIS, COMPLETE
BACTERIA: NONE SEEN
BILIRUBIN, UR: NEGATIVE
BLOOD: NEGATIVE
GLUCOSE, UR: NEGATIVE mg/dL (ref 0–75)
Ketone: NEGATIVE
Leukocyte Esterase: NEGATIVE
NITRITE: NEGATIVE
PH: 6 (ref 4.5–8.0)
Protein: NEGATIVE
Specific Gravity: 1.009 (ref 1.003–1.030)

## 2015-01-15 LAB — SALICYLATE LEVEL

## 2015-01-15 LAB — ETHANOL

## 2015-01-15 LAB — ACETAMINOPHEN LEVEL: Acetaminophen: <10 ug/mL

## 2015-01-19 NOTE — Consult Note (Signed)
Psychiatry: Follow-up this gentleman with a history of mental illness.  On examination today the patient is significantly worse than yesterday.  He is not able to give any history. review of systems patient is not able to answer any direct questions does not appear to be in physical distress or pain.  Not acting out to injure herself or others.  On mental status patient is disheveled with worse self care than yesterday.  Patient makes only intermittent eye contact.  His movements are only partially purposeful.  He is moaning but not making any comprehensible vocalizations.  Not able to answer any questions.  Affect looks frightened. obtained from his sister who was present today.  She reports that he had started to decompensate prior to admission.  She says that she has seen him in a mental state like this before years ago.  She believes that they may have recently decreased some of his medicine as an outpatient but she does not know which ones those are. is reported to be able to dress self verbalize take care of most of his ADLs fairly independent though still chronically impaired. has been receiving clonazepam 1 mg twice a day and Zyprexa 10 mg at night to match his outpatient medicine.  I will increase his Zyprexa to 15 mg at night after giving him a 5 mg 1 time dose today and also increase the clonazepam to 1 mg 3 times a day for anxiety.  Patient may be decompensating at this point just from being out of his familiar environment.  Follow-up tomorrow.  Diagnosis psychosis NOS  Electronic Signatures: Hiroyuki Ozanich, Jackquline DenmarkJohn T (MD)  (Signed on 13-Dec-15 13:15)  Authored  Last Updated: 13-Dec-15 13:15 by Audery Amellapacs, Mihailo Sage T (MD)

## 2015-01-19 NOTE — Consult Note (Signed)
Psychiatry: Follow-up for this 81 year old man who was brought into the hospital agitated and psychotic.  Increased dose of Zyprexa.  Increase dose of Klonopin.  Today I found the patient asleep.  Did not arouse to speech. current medication.  Reevaluated during the day tomorrow to see if he is staying oversedated in which case we could cut down on some of the Klonopin.  For now continue current medicine.  We are working on referral to geriatric psychiatry units if possible.  No change to diagnosis.  Electronic Signatures: Clapacs, Jackquline DenmarkJohn T (MD)  (Signed on 29-Dec-15 18:21)  Authored  Last Updated: 29-Dec-15 18:21 by Audery Amellapacs, John T (MD)

## 2015-01-19 NOTE — Consult Note (Signed)
Brief Consult Note: Diagnosis: delirium.   Patient was seen by consultant.   Consult note dictated.   Orders entered.   Comments: Psychiatry: PAtient with delirium. Orders done for stat im geodon and prn medication. Will follow.  Electronic Signatures: Audery Amellapacs, River Mckercher T (MD)  (Signed 11-Dec-15 18:46)  Authored: Brief Consult Note   Last Updated: 11-Dec-15 18:46 by Audery Amellapacs, Aryka Coonradt T (MD)

## 2015-01-19 NOTE — Consult Note (Signed)
PATIENT NAME:  Charles Irwin, Charles Irwin MR#:  [id number] DATE OF BIRTH:  28-Oct-1931  DATE OF CONSULTATION:  09/07/2014  REFERRING PHYSICIAN:   CONSULTING PHYSICIAN:  Charles Amel, MD  IDENTIFYING INFORMATION AND REASON FOR CONSULT: The patient is an 81 year old man with a history of bipolar disorder or schizophrenia, who came into the hospital with acute mental status changes of unclear etiology. Consult for mental status changes and delirium essentially.   HISTORY OF PRESENT ILLNESS: Information obtained largely from the chart and from observation of the patient. The patient presented to the hospital with what sounds like a pretty acute decompensation with mental status changes with delirium and incoherency. On interview today, the patient would not respond to me, other than to moan when I spoke his name. He was not able to answer any questions or give any other verbal information. Apparently, this had been a fairly rapid decompensation on the patient's part. So far, a urinary tract infection and has been identified. Since he has been in the hospital, he has been intermittently very agitated. He has gotten, run around the room, pulled out lines. It does not look like he has actually been attempting to hurt anybody.   PAST PSYCHIATRIC HISTORY: The information I have is limited, but it sounds like he has had a long history of chronic mental illness, possible mental retardation, and was maintained on antipsychotics and had functioned very well for years. It is described in intake that normally he is functional and calm. His verbal level normally is unclear.   FAMILY HISTORY: Unknown.   SOCIAL HISTORY: Lives in a group home.   PAST MEDICAL HISTORY: High blood pressure, diabetes, gastric reflux symptoms, possibly glaucoma.   MEDICATIONS: On admission, Zyrtec 10 mg a day, Zyprexa 10 mg a day, Zofran 4 mg q. 8 hours p.r.n. nausea, Travatan 1 drop to each eye daily, Robitussin p.r.n., Prilosec 40 mg daily,  lisinopril 10 mg daily, Lipitor 20 mg daily, glipizide XL 5 mg daily, Fosamax 70 mg once a week, clonazepam 1 mg b.i.d., aspirin 81 mg daily.   ALLERGIES: No known drug allergies.   SUBSTANCE ABUSE HISTORY: None known, but it does not appear to be, in any way, a part of the acute problem.   REVIEW OF SYSTEMS: The patient is not able to answer any questions or give any review of systems. He does not indicate any specific complaint right now.   MENTAL STATUS EXAMINATION: Elderly gentleman who was in a hospital bed moaning when I came into the room. He made no eye contact with me. He was moaning loudly and kicking in his bed. He was able to respond by saying his first name on one occasion, but did not answer any other questions. Nursing staff reports that he will lay in bed moaning like this, and then intermittently get up and moved around the room somewhat pointlessly, and then get back into bed. He has verbalized a little in the past, but has not really made sense since he has been here. Not able to do any other evaluation of the mental status.   LABORATORY RESULTS: Urinalysis: 12 white blood cells, trace bacteria, trace leukocyte esterase. CT of the head without contrast shows no acute intracranial abnormalities. Stable atrophy. Chemistry<< MISSING TEXT>> panel: Multiple abnormalities, elevated creatinine 2.23.  (Dictation Anomaly) sodium 143, potassium elevated at 5.4,  (Dictation Anomaly) <<glucoseMISSING TEXT>> elevated at 127.  CBC normal.   ASSESSMENT: An 81 year old man with what sounds like a history of chronic mental  impairment, possibly some mental retardation, who presents very delirious. So far the only identified reason would be the urinary tract infection and renal compromise. It is not unusual for people with mental retardation and chronic mental illness to decompensate much worse than their peers with medical illness, and this is particularly true for an elderly person. It is certainly  possible that the infection could be the underlying cause of his acute delirium.   TREATMENT PLAN: For some reason, it looks like he has not gotten any medication including IM medicines today. He needs to get a shot of IM Geodon to calm down and help him sleep right now, and I have put in an order for that, 10 mg. I also put in for 10 mg of IM Geodon q. 8 hours p.r.n. for agitation, and his Zyprexa to be given as a dissolvable Zydis tablets tonight. We will follow up tomorrow.   DIAGNOSIS, PRINCIPAL AND PRIMARY:  AXIS I: Delirium due to multiple medical problems, largely urinary tract infection.   SECONDARY DIAGNOSES:  AXIS I: Schizophrenia by history, mild mental retardation.    ____________________________ Charles AmelJohn T. Clapacs, MD jtc:MT D: 09/07/2014 18:44:54 ET T: 09/07/2014 19:02:49 ET JOB#: [id number]  cc: Charles AmelJohn T. Clapacs, MD, <Dictator> Charles AmelJOHN T CLAPACS MD ELECTRONICALLY SIGNED 09/09/2014 11:26

## 2015-01-19 NOTE — Consult Note (Signed)
Psychiatry: Follow-up for this 81 year old man with a history of schizophrenia or schizoaffective disorder as well as developmental disability.  Recently delirious and agitated in the hospital.  Yesterday I had increased his dose of olanzapine and increased his clonazepam.  Patient himself ascomplaints today.  Staff reports that he has continued to have some intermittent periods of agitation. review of systems patient is not able to answer any questions and give direct information.  He does not appear to be in any pain or physical distress. mental status this is a disheveled gentleman who is sitting up in a chair.  Engages in conversation to some degree.  Intermittent eye contact.  Slow psychomotor activity.  Speech remained slurred and difficult to understand affect blunted.  Mood not stated.  No evidence of acute dangerousness towards self or others. old charts some more.  In the past he had been on olanzapine 20 mg at night.  I'm going to increase his dose to 20 mg at night and continue the clonazepam at the current dose.  No other changes to medicine today.  We will continue to follow up.  Electronic Signatures: Ebelyn Bohnet, Jackquline DenmarkJohn T (MD)  (Signed on 14-Dec-15 13:00)  Authored  Last Updated: 14-Dec-15 13:00 by Audery Amellapacs, Zimere Dunlevy T (MD)

## 2015-01-19 NOTE — Consult Note (Signed)
Psychiatry: Follow-up 81 year old gentleman with history of schizophrenia.  Patient today states he is feeling afraid but can't articulate what he is afraid of.  Denies any other new physical complaints. denies any pain any physical symptoms.  Denies having hallucinations.  Denies suicidal or homicidal ideation. is more awake alert and interactive.  Makes good eye contact.  Psychomotor activity is fairly normal.  Speech still is decrease in total amount and a little hard to understand.  Affect blunted and nervous.  Thoughts seem paranoid.  Denies suicidal or homicidal ideation.  Remains confused although he does know he is in a hospital. appears to be somewhat improved compared to yesterday.  Not over sedated today.  He has been turned down by multiple geriatric facilities.  We can reevaluate and if he seems to be returning to baseline consider discharge back to his group home.  Continue current medicines for now.  Electronic Signatures: Aletta Edmunds, Jackquline DenmarkJohn T (MD)  (Signed on 30-Dec-15 18:17)  Authored  Last Updated: 30-Dec-15 18:17 by Audery Amellapacs, Laron Boorman T (MD)

## 2015-01-19 NOTE — Discharge Summary (Signed)
PATIENT NAME:  Charles BeachRUSSELL, Brinley MR#:  [id number] DATE OF BIRTH:  1932-08-12  DATE OF ADMISSION:  09/07/2014 DATE OF DISCHARGE:  09/12/2014  DISCHARGE DIAGNOSES:  1.  Altered mental status secondary to psychosis not otherwise specified.  2.  History of schizophrenia.  3.  Adult onset diabetes.  4.  Sinusitis, is resolved.  5.  Acute renal insufficiency.   DISCHARGE MEDICATIONS:  1. Aspirin 81 mg p.o. daily.  2. Prilosec 40 mg p.o. daily.  3. Glipizide XL 5 mg p.o. daily.  4. Lipitor 20 mg p.o. at bedtime.  5. Zyrtec 10 mg p.r.n. daily as needed for allergies.  6. Travatan ophthalmic solution 1 drop each affected eye once a day in the evening.   7. Robitussin 10 mL q. 6 hours as needed for cough.  8. Saline nasal mist 3 times a day as needed.  9. Lisinopril 5 mg 2 tabs once daily.  10. Zofran 4 mg ODT dissolve in the mouth every 8 hours as needed for nausea and vomiting.  11. Olanzapine 20 mg disintegrating tablet 1 tab at bedtime.  12. Clonazepam 1 mg p.o. t.i.d. as needed for anxiety.  13. Glucerna shakes t.i.d. with meals.   CONSULTS: Psychiatry.   PERTINENT LABORATORY AND STUDIES: On day of discharge prior to discharge sodium of 142, potassium 3.7, creatinine 1.91, glucose 147. White blood cell count 7, hemoglobin 12.8, and platelets of 210,000. CT of the head showed no acute abnormalities except  acute sinusitis.   BRIEF HOSPITAL COURSE:  Altered mental status. The patient initially came in with changes in his baseline mental status that were secondary to his psychiatric issues, psychosis not otherwise specified with underlying schizophrenia. Per his sister he had medications changed recently by an unknown person and that has made a difference since then. He was seen by psychiatry in the hospital and medications were increased, olanzapine was increased to 20 and the clonazepam t.i.d. and he has become closer to baseline. He is now functioning at his baseline state with regards to  eating and taking his medications and responding to others, so it is safe for him to be discharged back to his group home, but will need supervision. Other chronic issues are stable except for the acute renal insufficiency. Plan to continue with the lisinopril, needs to increase his hydration, but this needs to be followed up as an outpatient when he sees me in the office.   DISPOSITION: In stable condition and discharge to group home. Follow up with me in 10 days.    ____________________________ Marisue IvanKanhka Gunhild Bautch, MD kl:bu D: 09/12/2014 12:10:05 ET T: 09/12/2014 20:34:50 ET JOB#: [id number]  cc: Marisue IvanKanhka Wilmont Olund, MD, <Dictator> Marisue IvanKANHKA Melony Tenpas MD ELECTRONICALLY SIGNED 09/18/2014 15:28

## 2015-01-19 NOTE — Consult Note (Signed)
Psychiatry: Follow-up for this patient with chronic mental illness and developmental disability.  On interview today he was awake but sleepy.  He was able to tell me his name and knew that he was in the hospital.  Nursing staff report that he has been more communicative and pleasant today not agitated like he was yesterday.  Just got sleepy after taking his medicine. does not appear to be responding to internal stimuli does not appear to be acutely dangerous to self.  Tolerating medicine well. current medicine as prescribed.  Follow-up tomorrow.  He may be getting well enough to be able to go home soon.  No change to diagnosis.  Electronic Signatures: Trenyce Loera, Jackquline DenmarkJohn T (MD)  (Signed on 15-Dec-15 17:56)  Authored  Last Updated: 15-Dec-15 17:56 by Audery Amellapacs, Chiffon Kittleson T (MD)

## 2015-01-19 NOTE — Consult Note (Signed)
Psychiatry: Follow-up for this elderly gentleman with a history of chronic mental illness and developmental disability.  Yesterday when I saw him he was delirious and unable to give any history.  Presentation today is remarkably different.  Patient himself had no complaints that I can identify.  Denied having any pain or feeling sick to his stomach or having any trouble eating. mental status patient was neatly groomed and dressed.  Awake.  Calm.  Made good eye contact and was appropriately interactive.  Speech is slurred and it is very difficult to make out much of what he is saying although he seems to be trying to communicate something articulate.  Affect euthymic.  Mood stated as okay.  Did not appear to be responding to internal stimuli.  Not lashing out or getting aggressive.  Cooperative with treatment. if this is back to his baseline but he certainly much much better.  Not clear what the plan is.  He may be ready to go home soon.  Medications for delirium were being given as a when necessary.  Patient shouldn't need anything further as a when necessary or additional psychiatric medicine beyond his usual medicine when he gets ready to go home. delirium resolving  Electronic Signatures: Raiford Fetterman, Jackquline DenmarkJohn T (MD)  (Signed on 12-Dec-15 12:39)  Authored  Last Updated: 12-Dec-15 12:39 by Audery Amellapacs, Elainna Eshleman T (MD)

## 2015-01-19 NOTE — H&P (Signed)
PATIENT NAME:  Charles Irwin, [name]:  [id number] DATE OF BIRTH:  10-23-1931  DATE OF ADMISSION:  09/07/2014  PRIMARY CARE PROVIDER: Marisue IvanKanhka Linthavong, MD.   EMERGENCY DEPARTMENT REFERRING PHYSICIAN: Daryel NovemberJonathan Williams, MD.   CHIEF COMPLAINT: Acute encephalopathy, agitation.   HISTORY OF PRESENT ILLNESS: The patient is an 81 year old African American male who lives in a group home. Normally is very active and goes to the gym, who also has a history of bipolar disorder and mild MR, who is sent from the group home for patient acting agitated and confused. The patient in the ER was noted to have abnormal renal function that was slightly worse and a possible mild UTI; therefore, we were asked to admit the patient. The patient currently is very agitated and is trying to pull the IV out. No further history is obtainable from his chart. He has a history of prostate cancer, hyperlipidemia, diabetes type 2, hypertension, bipolar disorder, mild MR.   ALLERGIES: None.   MEDICATIONS: He is on Zyrtec 10 daily, Zyprexa 10 daily, Zofran 4 mg q. 8 p.r.n., Travatan 0.004% one drop to each affected eye daily, Robitussin 10 mL q. 6 p.r.n., Prilosec 40 mg 1 tab p.o. daily, lisinopril 10 mg daily, Lipitor 20 daily, glipizide XL 5 daily, Fosamax 70 mg once a week, clonazepam 1 mg b.i.d., aspirin 81 mg 1 tab p.o. daily.   SOCIAL HISTORY: Unobtainable.   FAMILY HISTORY: Unavailable.   REVIEW OF SYSTEMS: Unable to obtain due to patient's agitated state.   PHYSICAL EXAMINATION:  VITAL SIGNS: Temperature 97.4, pulse 102, respirations 14, blood pressure 164/79.  GENERAL: The patient is a well-built male. He is currently agitated. Exam is limited due to his affect.   HEENT: Head atraumatic, normocephalic. Pupils equally round and react to light and accommodation. There is no conjunctival pallor. No scleral icterus. Nasal exam shows no drainage or ulceration. He is refusing to open his mouth. External nasal exam shows no  drainage or ulceration. External ear exam shows no erythema or drainage.  NECK: Supple without any thyromegaly.  CARDIOVASCULAR: Tachycardic. No murmurs, rubs, clicks, or gallops.  LUNGS: Clear to auscultation bilaterally without any rales, rhonchi, wheezing.  ABDOMEN: Soft, nontender, nondistended. Positive bowel sounds x 4. No hepatosplenomegaly.  EXTREMITIES: No clubbing, cyanosis, or edema.  SKIN: No rash.  LYMPH NODES: Nonpalpable.  VASCULAR: Good DP, PT pulses.  PSYCHIATRIC: Very anxious and agitated.  NEUROLOGICAL: Cranial nerves II through XII grossly appear intact. He is moving all extremities spontaneously.   EVALUATION: In the ED, CT scan shows no acute intracranial abnormalities, stable atrophy, white matter disease, right maxillary sinusitis.   LABORATORY DATA: Glucose 127, BUN 30, creatinine 2.23, sodium 143, potassium 5.4, chloride 110, CO2 is 28, calcium 9.2. LFTs are normal. WBC 7.0, hemoglobin 13.6, platelet count 255,000.   ASSESSMENT AND PLAN: The patient is an 81 year old African American male with history of hypertension, diabetes, bipolar disorder. Lives in a group home. Admitted with agitation and confusion.  1.  Acute encephalopathy, possibly due to some dehydration with elevated creatinine, mild urinary tract infection. Will give IV fluids and antibiotics. Also, this could be possible exacerbation of his bipolar disorder. A psychiatric evaluation. Will do p.r.n. haloperidol.  2.  Acute renal failure due to dehydration. We will give IV fluids. Hold lisinopril.  3.  Hyperlipidemia. Continue Lipitor.  4.  Diabetes. The patient will be on sliding scale insulin, glipizide.  5.  Miscellaneous. We will do Lovenox for deep vein thrombosis prophylaxis.   TIME  SPENT: 50 minutes.    ____________________________ Lacie Scotts Allena Katz, MD shp:at D: 09/07/2014 14:39:25 ET T: 09/07/2014 15:18:11 ET JOB#: [id number]  cc: [name], MD, <Dictator> [name]  MD ELECTRONICALLY SIGNED 09/12/2014 10:40

## 2015-01-23 ENCOUNTER — Emergency Department
Admit: 2015-01-23 | Disposition: A | Discharge status: Home / Self Care | Payer: Self-pay | Admitting: Emergency Medicine

## 2015-01-23 LAB — COMPREHENSIVE METABOLIC PANEL
ALK PHOS: 82 U/L
AST: 28 U/L
Albumin: 3.4 g/dL — ABNORMAL LOW
Anion Gap: 10 (ref 7–16)
BILIRUBIN TOTAL: 0.6 mg/dL
BUN: 46 mg/dL — ABNORMAL HIGH
CO2: 23 mmol/L
CREATININE: 2.05 mg/dL — AB
Calcium, Total: 8.8 mg/dL — ABNORMAL LOW
Chloride: 105 mmol/L
EGFR (African American): 34 — ABNORMAL LOW
GFR CALC NON AF AMER: 30 — AB
GLUCOSE: 89 mg/dL
Potassium: 4.7 mmol/L
SGPT (ALT): 20 U/L
Sodium: 138 mmol/L
Total Protein: 6.6 g/dL

## 2015-01-23 LAB — URINALYSIS, COMPLETE
Bacteria: NONE SEEN
Bilirubin,UR: NEGATIVE
Glucose,UR: NEGATIVE mg/dL (ref 0–75)
Ketone: NEGATIVE
LEUKOCYTE ESTERASE: NEGATIVE
Nitrite: NEGATIVE
PH: 6 (ref 4.5–8.0)
Protein: NEGATIVE
Specific Gravity: 1.005 (ref 1.003–1.030)
Squamous Epithelial: NONE SEEN

## 2015-01-23 LAB — CBC
HCT: 35.5 % — ABNORMAL LOW (ref 40.0–52.0)
HGB: 11.7 g/dL — ABNORMAL LOW (ref 13.0–18.0)
MCH: 29.4 pg (ref 26.0–34.0)
MCHC: 32.8 g/dL (ref 32.0–36.0)
MCV: 89 fL (ref 80–100)
Platelet: 255 10*3/uL (ref 150–440)
RBC: 3.97 10*6/uL — AB (ref 4.40–5.90)
RDW: 14.1 % (ref 11.5–14.5)
WBC: 8.7 10*3/uL (ref 3.8–10.6)

## 2015-01-23 LAB — TROPONIN I: Troponin-I: 0.03 ng/mL

## 2015-01-23 NOTE — Consult Note (Signed)
PATIENT NAME:  Charles Irwin, Charles Irwin MR#:  [id number] DATE OF BIRTH:  1932/09/09  DATE OF CONSULTATION:  09/24/2014  REFERRING PHYSICIAN:   CONSULTING PHYSICIAN:  Audery AmelJohn T. Pegah Segel, MD  IDENTIFYING INFORMATION AND REASON FOR CONSULTATION: This is an 81 year old man with a history of chronic mental illness, who was brought into the hospital from his group home after becoming agitated and confused.   CHIEF COMPLAINT: "I've never been sick."   HISTORY OF PRESENT ILLNESS: Information obtained from the chart and from the patient's sister as well as from the patient. This 81 year old gentleman had been discharged from the hospital on December 16 and gone back to his group home. In the last few days before coming back to the hospital it sounds like he had decompensated again, becoming agitated. It is not clear if he has actually been assaultive. More confused, difficult to manage. He was brought back to the hospital because of unmanageability. Sister reports that he is back in the kind of abnormal mental state that she identifies with his being off of his psychiatric medicine. She feels like try as they will his current group homes simply not capable to manage him, but she has been advised not to move him because of the difficulty he would have with the change. I note from his medical information that was sent with him that at the group home he was being given Zyprexa 10 mg at night. This is half of the dose he was being given at discharge from the hospital on the 16th. It looks like they never bothered to increase the dose as an outpatient. The patient is not able to describe any history or report any particular stresses.   PAST PSYCHIATRIC HISTORY: Long history of mental illness, diagnosis of schizophrenia, also likely mental retardation. Has had hospitalizations several times in the past. Has been maintained on antipsychotics for years. No known history of suicide attempts.   FAMILY HISTORY: None identified.    SOCIAL HISTORY: The patient has recently been living in a group home. His sister Charles Irwin is extremely involved and close with the patient.   PAST MEDICAL HISTORY: The patient has adult onset diabetes and acute renal insufficiency.   SUBSTANCE ABUSE HISTORY: No known history of substance abuse problems.  REVIEW OF SYSTEMS: The patient is really not able to offer any. He is pretty incoherent right now.   CURRENT MEDICATIONS: The information we have is aspirin 81 mg a day, lisinopril 5 mg a day, clonazepam 1 mg twice a day, glipizide 5 mg extended release once a day, Zofran p.r.n. for nausea, cetirizine 10 mg once a day, Lipitor 20 mg once a day, Zyprexa 10 mg at night, Travatan ophthalmic solution 1 drop to each eye at bedtime, Prilosec 40 mg a day.   ALLERGIES: No known drug allergies.   MENTAL STATUS EXAMINATION: An 81 year old gentleman interviewed in the Emergency Room. He was curled up in the fetal position, but woke up and made eye contact when I spoke with him. Eye contact was good. Psychomotor activity was fidgety, he was reaching out and moving his arms constantly. His speech was completely incoherent. I think he was repeating the phrase "I never get sick," but I was unable to be sure of that. He seemed to be trying to talk, but I could not understand any of it. Thoughts are impossible to be clear about. The patient has not been aggressive or violent in the Emergency Room, not acting to hurt himself. Has generally been cooperative  so far.   LABORATORY RESULTS: Head CT, nothing different from last time, nothing new or remarkable. Chemistry panel, creatinine elevated at 1.76, BUN elevated at 26. CBC, low hematocrit at 38.8. Urinalysis unremarkable. Alcohol negative.   ASSESSMENT: An 81 year old man with history of chronic mental illness and probable mental retardation. When he was here in the hospital the last time I had increased his Zyprexa to 20 mg at night, which seemed to put him  back to his baseline. When he was discharged home they did not increase it as ordered and he has been back on his 10. Now he is back to being agitated and confused. At this point they cannot manage him at the group home. Inappropriate for admission to our hospital because of age and mental retardation. Needs referral to geropsychiatry.   TREATMENT PLAN: Increase Zyprexa to 20 mg at night. Increase Klonopin to 1 mg 3 times a day. Work on referral to geriatric psychiatry unit.   DIAGNOSIS PRINCIPAL AND PRIMARY:   AXIS I: Schizophrenia.   SECONDARY DIAGNOSES:   AXIS I: Deferred.   AXIS II: Mild mental retardation.   AXIS III: Deferred.     ____________________________ Audery Amel, MD jtc:bu D: 09/24/2014 17:30:42 ET T: 09/24/2014 17:54:02 ET JOB#: [id number]  cc: Audery Amel, MD, <Dictator> Audery Amel MD ELECTRONICALLY SIGNED 10/01/2014 15:15

## 2015-01-27 NOTE — Consult Note (Signed)
PATIENT NAME:  Charles Irwin, Andru MR#:  [id number] DATE OF BIRTH:  [birth year]  DATE OF CONSULTATION:  10/31/2014  REFERRING PHYSICIAN:   CONSULTING PHYSICIAN:  Audery AmelJohn T. Levern Pitter, MD  HISTORY OF PRESENT ILLNESS: This is an update for this 81 year old gentleman with a history of schizophrenia and developmental disability, who has been in the Emergency Room for over 240 hours at this point. On interview today, I found the patient to be awake and responsive. He had no new complaints. He did not complain of any side effects of medication.   REVIEW OF SYSTEMS: He had no physical complaints to full review of systems. The patient denied having any hallucinations. Denies any suicidal ideation. He is oriented to being in the hospital, but disoriented to the specific time. His behavior has been calm. Able to eat and drink without disruption.   MEDICATIONS: Have been changed with the discontinuation of Zyprexa and initiation of Saphris 10 mg sublingual at bedtime. He appears to be tolerating this medicine. No sign of extrapyramidal symptoms.   ASSESSMENT: In my assessment, he appears to be more awake and alert than he was when he was taking the Zyprexa. He has not returned to any agitated or disruptive behavior. There have been multiple discussions with the patient's sister and with Emergency Room physician and psychiatry team.   PLAN: At this point, the plan is to try referring him back to his group home on his current medication. Because of the difficulty we have had coming to a longer term plan, the sister has stated, she will try to have [facility] if rehospitalization is considered. I have to agree that I think this is probably a better plan, as he is more likely to able to get in to a geriatric psychiatry hospital setting from there.   DIAGNOSES: Schizophrenia, mild mental retardation, working on discharge planning.    ____________________________ Audery AmelJohn T. Gelila Well, MD jtc:mw D: 10/31/2014  13:22:39 ET T: 10/31/2014 13:44:47 ET JOB#: [id number]  cc: Audery AmelJohn T. Iyania Denne, MD, <Dictator> Audery AmelJOHN T Britnee Mcdevitt MD ELECTRONICALLY SIGNED 11/07/2014 17:25

## 2015-01-27 NOTE — Consult Note (Signed)
Psychiatry: Follow-up for this patient with schizophrenia and MR.  He continues to be free of any behavior problems.  Voices no complaints.  Mental status calm and stable.  No indication for inpatient hospitalization.  Continue to try to work on getting him discharged back to his group home.  No other change to medicine indicated.  Electronic Signatures: Audery Amellapacs, John T (MD)  (Signed on 06-Jan-16 17:37)  Authored  Last Updated: 06-Jan-16 17:37 by Audery Amellapacs, John T (MD)

## 2015-01-27 NOTE — Consult Note (Signed)
PATIENT NAME:  Charles Irwin, Charles Irwin MR#:  [id number] DATE OF BIRTH:  06/01/1932  DATE OF CONSULTATION:  10/20/2014  CONSULTING PHYSICIAN:  Tyrel Lex K. Guss Bundehalla, MD  PLACE [facility] DICTATION[facility] [place].  SEX: Male.  RACE: African American.  AGE: 81 years.  SUBJECTIVE: The patient was seen in [facility]. The patient is an 81 year old African American male with a long history of mental illness; has been living at a group home. The patient was [facility] and was stabilized on Zyprexa 20 mg. The patient went home and then this medication was reduced to Zyprexa 10 mg p.o. at bedtime and he became catatonic. He was brought back for help.   OBJECTIVE: The patient was seen lying in bed. He was drowsy and would not open his eyes. He [facility] not [facility] at [facility] or the staff. Probably he is preoccupied. No gestures. Staff reported that he continues to stay like this as long as Zyprexa is not adjusted to the exact dose. Probably he is preoccupied and psychotic. Insight and judgment impaired. Impulse control poor.   IMPRESSION: Schizophrenia, chronic, paranoid, in exacerbation--partial remission.  RECOMMEND: Start the patient back on Zyprexa 20 mg after discontinuing Zyprexa 10 mg p.o. at bedtime.  In addition pt has been pn Klonopin one mg po bid which was started again as this was d/ced  and pt needs the sameAfter the patient is stable, can be considered to go back to the same living situation.          ____________________________ Jannet MantisSurya K. Guss Bundehalla, MD skc:ts D: 10/20/2014 17:21:32 ET T: 10/20/2014 18:25:24 ET JOB#: [id number]  cc: Monika SalkSurya K. Guss Bundehalla, MD, <Dictator> Beau FannySURYA K Lakai Moree MD ELECTRONICALLY SIGNED 10/21/2014 12:24

## 2015-01-27 NOTE — Consult Note (Signed)
PATIENT NAME:  Charles Irwin, [name]:  [id number] DATE OF BIRTH:  1932-09-21  DATE OF CONSULTATION:  12/20/2014  REFERRING PHYSICIAN:   CONSULTING PHYSICIAN:  Audery AmelJohn T. Alisandra Son, MD  IDENTIFYING INFORMATION AND REASON FOR CONSULTATION: This is an 81 year old male with a history of schizophrenia and dementia who was sent here from his current living situation with a report that he has been more agitated.   CHIEF COMPLAINT: Not stated.   HISTORY OF PRESENT ILLNESS: Information from the patient and the chart. He was sent here from [facility] group home who reports that he has been moaning and screaming, reporting hallucinations, more agitated, having more outbursts. The patient told the intake nurse that he did not like his medicine and was going to spit it out. He is reported by the staff at his group home to be spitting out his medicine and knocking cups out of people's hands. It is not clear what the time course is of this happening. The patient is not able to provide any further history. It is not clear if he has had any recent changes to his medicine.   PAST PSYCHIATRIC HISTORY: The patient has a long history of schizophrenia and supposedly was quite stable for years. Within the last year, he has had a decompensation. Mr. Perlie GoldRussell was in our Emergency Room for couple of extended stays around the turn of the year. During that time, we saw very little improvement. After the last discharge, his sister had reported a plan to have [facility]. I am not sure what has come of that. During our time working with him, antipsychotic adjustments seem to have little effect on his overall behavior and symptoms.   PAST MEDICAL HISTORY: History of high blood pressure, diabetes, glaucoma.   SOCIAL HISTORY: Closest relative is his sister. She takes quite an interest in him. Otherwise, minimal family involvement. Currently living in a group home.   FAMILY HISTORY: Unknown.   SUBSTANCE ABUSE  HISTORY: Nothing recent and apparently no significant past substance abuse problems.   CURRENT MEDICATIONS: Olanzapine 20 mg at night, Prilosec 40 mg once a day, glipizide XL 5 mg once a day, Lipitor 20 mg once a day, Klonopin 0.5 mg in the morning 1 mg at night, aspirin 81 mg a day, lisinopril 5 mg 2 of them in the morning, Travatan ophthalmic solution 1 drop to each eye at bedtime, Zofran p.r.n., Debrox in the ears p.r.n., Fosamax 70 mg once a day, Zyrtec 10 mg once a day as needed.   ALLERGIES: No known drug allergies.   REVIEW OF SYSTEMS: The patient denied any pain, denied shortness of breath. Denied nausea. Denied having any hallucinations. Negative overall full review of systems.   MENTAL STATUS EXAMINATION: Elderly gentleman, looks his stated age, just barely cooperative with any of the interview. Eye contact intermittent. Psychomotor activity very slow and sluggish. He was able to pull himself up to about halfway sitting posture to drink some liquid and that was about it. Speech was decreased in total amount. A little hard to understand. Only spoke a few words at a time. The patient was oriented to being in a hospital. Could not tell me anything else about where he was. Could not tell me the date. Could not answer any questions about mood. Did not answer any further questions about hallucinations. Could not participate in any cognitive testing.   LABORATORY RESULTS: Hemoglobin and hematocrit are low at 12.4 and 38.2. The rest of the  CBC unremarkable. Creatinine elevated at 2.3, BUN elevated at 30, glucose a little elevated at 124.   VITAL SIGNS: Blood pressure 149/73, respirations 16, pulse 84, temperature 97.5.   ASSESSMENT: An 81 year old man with a history of schizophrenia and dementia. Recently apparently with worsening behavior at his group home. To my evaluation today, he appears to be at the baseline I am familiar with him having last time he was in our Emergency Room.   TREATMENT  PLAN: I would recommend that we try and get him discharged back to his group home. The current problems can be dealt with outpatient. I do not think he is eligible for admission here nor likely to benefit from geriatric hospitalization, especially given that we had almost no success finding him geriatric beds in the past. I have asked our staff to talk with his group home and see if we can send him back as soon as possible. Continue current medicines for now.   DIAGNOSIS, PRINCIPAL AND PRIMARY:  AXIS I: Schizophrenia.   SECONDARY DIAGNOSES: AXIS I:  1.  Dementia, mixed type.  2.  High blood pressure.   3.  Diabetes.  4.  Anemia.    ____________________________ Audery Amel, MD jtc:at D: 12/20/2014 16:25:28 ET T: 12/20/2014 16:37:27 ET JOB#: [id number]  cc: Audery Amel, MD, <Dictator> Audery Amel MD ELECTRONICALLY SIGNED 12/31/2014 9:58

## 2015-01-27 NOTE — Consult Note (Signed)
Psychiatry: Follow-up for this patient who is still in the emergency room.  Patient was seen Thursday evening.  Case discussed with emergency room psychiatry staff and with the doctor on staff at that time, Dr. Heidi DachGaylel.  This 81 year old gentleman was unresponsive to verbal stimulation.  He also was not responding verbally or with any meaningful reply to gentle shaking or touching.  He was not able to give any history. have been unable to locate intake and outtake charting but the nursing staff report that the patient has not been eating or drinking well at all.  Labs yesterday show an elevated BUN and creatinine likely indicative of dehydration. is so far no other finding to indicate a medical cause for his mental status changes.  He has had a head CT that was unremarkable.  I don't think we have done an MRI recently.  Patient does have a history of schizophrenia and has been declining recently.  In his current state I'm dubious as to whether he will be able to take his oral medicine. have sought repeatedly to find placement at a geriatric facility for this patient.  No success.  Social work tells me that the patient's sister has not been agreeable to finding a new nursing home or similar facility for him. am concerned that this patient may be getting sicker wall "falling between the cracks" in that he is not appropriate for admission to the psychiatry unit at our hospital but so far has not been judged to require medical admission either. plan to discuss the case further on Friday with psychiatry staff in the emergency room and the physicians and may discuss this with a wider array of medical personnel. Possible catatonia most likely related to mental illness.  Electronic Signatures: Audery Amellapacs, Tiaunna Buford T (MD)  (Signed on 29-Jan-16 09:47)  Authored  Last Updated: 29-Jan-16 09:47 by Audery Amellapacs, Loralye Loberg T (MD)

## 2015-01-27 NOTE — Consult Note (Signed)
Psychiatry: Follow-up note.  This 81 year old gentleman with schizophrenia and dementia was a little more forthcoming when I saw him later in the afternoon.  He knows he is in the hospital.  Can't really tell me why.  He admits that he was spitting his medicine out also doesn't have any reason for it.  Denies any anger or hostility or wish to harm anyone.  He is quite enthusiastically in favor of going back to the group home. reason for change in any psychiatric medicine right now.  Tried to do a little psychoeducation with him around the importance of staying compliant with medicine.  Current mental health problems can be dealt with as an outpatient. is not on involuntary commitment.  I discussed the case with the psychiatric team in the ER.  From my point of view he can be discharged back home and follow up with his usual provider. Schizophrenia, dementia  Electronic Signatures: Clapacs, Jackquline DenmarkJohn T (MD)  (Signed on 24-Mar-16 17:45)  Authored  Last Updated: 24-Mar-16 17:45 by Audery Amellapacs, John T (MD)

## 2015-01-27 NOTE — Consult Note (Signed)
Psychiatry: PAtient seen and chart reviewed and case discussed with nursing. PAtient was awake and interactive though his speach is hard to understand. Seems disorganized and psychotic. Continue zyprexa 20mg  and add tegretol which was part of his regimin in the past. Disposistion remains unclear.  Electronic Signatures: Audery Amellapacs, Verlie Hellenbrand T (MD)  (Signed on 14-Jan-16 23:34)  Authored  Last Updated: 14-Jan-16 23:34 by Audery Amellapacs, Ketara Cavness T (MD)

## 2015-01-27 NOTE — Consult Note (Signed)
PATIENT NAME:  HELEN, CUFF MR#:  [id number] DATE OF BIRTH:  May 08, 1932  DATE OF CONSULTATION:  10/22/2014  REFERRING PHYSICIAN:   CONSULTING PHYSICIAN:  Audery Amel, MD  IDENTIFYING INFORMATION AND REASON FOR CONSULTATION: An 81 year old man with a past history of schizophrenia, possible developmental disability, who has been brought back to the Emergency Room 4 days after his most recent discharge, once again with the complaint that he has been agitated at the group home. The patient offers no chief complaint.   HISTORY OF PRESENT ILLNESS: This is I believe the sixth return of this gentleman to our hospital in the last month and a half. The patient has reportedly been having consistently problematic behavior at his group home. He is brought back this time with a report that he had been screaming, uncooperative, and disrobing, and unmanageable at home. Since coming to the Emergency Room, his behavior has been much as it usually is, completely withdrawn and nonagitated. This has been the picture on all of his previous evaluations. On interview today, the patient is not able to give me any history. He will grunt, but not in a way that he is clearly answering any questions and will not actually speak any words to me. It is not clear to me whether he was being given the medicine we have previously prescribed since being at home.   PAST PSYCHIATRIC HISTORY: The patient has a life long history of mental illness and has probably mental retardation. His sister is his closest contact and gives the best history. She reports that for many years throughout his lifetime he was relatively functional in a group home setting. He would pleasantly interact with people and could do basic ADLs. It has been relatively recently that he has suffered this decline and to greater confusion and now poor functioning, It was speculated at one point that this is because his psychiatric medicine had been decreased earlier in the  fall. No other known cause for this.   No known history of suicide attempts. Has not actually been violent here in the hospital. Diagnosis appears He appears to be schizophrenia. Had responded to Zyprexa 20 mg at night and modest doses of clonazepam.   PAST MEDICAL HISTORY: The patient has a history of strokes in the past. He otherwise is in relatively good health. Does not have diabetes.   FAMILY HISTORY: Unknown.   SOCIAL HISTORY: Has been residing at a group home. We have attempted to refer him to geriatric psychiatry hospitals without success recently. His sister is his closest relative and keeps a close eye on him. I am not certain whether she is actually a legal guardian.   CURRENT MEDICATIONS: He should be on Zyprexa 20 mg at night, Ativan 1 mg twice a day.   ALLERGIES: No known drug allergies.   REVIEW OF SYSTEMS: The patient offers none.   MENTAL STATUS EXAMINATION: Elderly man who looks his stated age, curled up in the fetal position in the hospital. Makes very brief eye contact. Psychomotor activity is essentially none. Speech consists of a few grunts that do not seem to answer any particular question. Affect flat. Mood not stated. Thoughts unanalyzable. Cannot do any formal cognitive testing at this point. Unknown thoughts of dangerousness.   LABORATORY RESULTS: The patient last had a CAT scan done on the 23rd, which was read as being consistent with all of his prior examinations, having a prior stroke but nothing that looked new. Urinalysis unremarkable. He has a chronic mild  anemia. Elevated creatinine 1.8, this has been chronic as well. Lactic acid normal.   VITAL SIGNS: Blood pressure 114/69, respirations 16, temperature 98.2, pulse 84.   ASSESSMENT: An 81 year old man with schizophrenia and possible mental retardation. Puzzling situation. Every time we send him home he seems to decompensate and they bring him back with the same report that he had been screaming and yelling and  taking his clothes off and behaving in a disruptive manner, although there is no indication that he was actually trying to harm anyone or harm himself. Every time he is in the Emergency Room, his behavior is completely calm and withdrawn for the most part with only occasional shouting out, but no aggressiveness.  TREATMENT PLAN: We are going to continue to negotiate with the group home and with his family about placement. We can also try to refer him once again to a geriatric psychiatry unit, as it seems that he is exhibiting psychiatric symptoms that are off his baseline when he is at home. Continue current medicines for now.   DIAGNOSIS, PRINCIPAL AND PRIMARY:  AXIS I: Schizophrenia.   SECONDARY DIAGNOSES:  AXIS I: No further.  AXIS II: Mild mental retardation.  AXIS III: History of stroke.    ____________________________ Audery AmelJohn T. Danyele Smejkal, MD jtc:TT D: 10/22/2014 14:47:49 ET T: 10/22/2014 15:05:14 ET JOB#: [id number]  cc: Audery AmelJohn T. Devaney Segers, MD, <Dictator> Audery AmelJOHN T Jamecia Lerman MD ELECTRONICALLY SIGNED 11/07/2014 17:25

## 2015-01-27 NOTE — Consult Note (Signed)
PATIENT NAME:  Charles Irwin, Charles Irwin MR#:  [id number] DATE OF BIRTH:  01-21-1932  DATE OF CONSULTATION:  10/01/2014  REFERRING PHYSICIAN:   CONSULTING PHYSICIAN:  Audery AmelJohn T. Eular Panek, MD  IDENTIFYING INFORMATION AND REASON FOR CONSULT: An 81 year old man with a history of schizophrenia and possible developmental disability, who returns to the hospital a mere 3 days after his most recent release. We are told that the staff found him naked outdoors in the cold at the group home. No other history forthcoming right now, the patient unable to offer any history at this point.   PAST PSYCHIATRIC HISTORY: Documented long-standing mental health problems with chronic psychosis, possible developmental disability, possible contribution of dementia at this point; the patient has been in the hospital several times recently. All 4 episodes of altered mental state, the most recent time, there really was no significant change I could see in his mental state when he was here. After several days, we managed to get him taken back to his group home. No known history of suicide attempts or violence; has been maintained on antipsychotics for years, had previously been on 20 mg of Zyprexa, which had been decreased; I have tried to increase it back from the 10 to 20, twice recently and it seems that, that is not being continued at home.   SOCIAL HISTORY: The patient has a legal guardian, but resides in a group home in the area.   PAST MEDICAL HISTORY: Glaucoma, dyslipidemia, mild diabetes, gastric reflux symptoms.   SUBSTANCE ABUSE HISTORY: None identified.   FAMILY HISTORY: None identified.   REVIEW OF SYSTEMS: The patient complains of being hungry; otherwise, denies any specific mood or psychotic symptoms or other physical complaints.   MENTAL STATUS EXAMINATION: Elderly gentleman, looks his stated age, awake and alert; makes intermittent eye contact, very passive in interaction, affect flat. Mood stated as okay. Thoughts are  very slow, very simple, very minimal, denies suicidal or homicidal ideation; denies hallucinations. He does occasionally express vague paranoia and feeling afraid. The patient is alert and oriented to being at a hospital and to the town that he is in; unable to recall any of 3 objects at 3 minutes. Judgment and insight chronically impaired.   DIAGNOSTIC DATA: Drug screen is all negative, urinalysis negative, acetaminophen and salicylates and alcohol negative. Chemistry panel, elevated creatinine 1.8, elevated BUN 26, glucose 112, CBC normal. Another head CT he which appears to be stable as compared to last ones. A chest x-ray showed no abnormality.   ASSESSMENT: This is an 81 year old man who was perfectly fine without any behavior problems in the Emergency Room for several days recently. We sent him back to his group home and now they found him naked outdoors. On examination today, the patient is once again at his baseline mental state. Not agitated, not showing acute behavior problems, very easily redirectable and manageable. Unclear whether this could be just not giving him enough medicine or there were some kind of distress that occurred at his group home or what, but he does not appear to be any different than he was before, does not require hospital level treatment.   TREATMENT PLAN: Continue outpatient medicine. Increase Zyprexa back up to 20 mg at night; discussed case with the nurse and the physician staff in the Emergency Room. We can try to refer him to go back to his group home again. We had a lot of trouble trying to refer him to a geriatric unit before. Not appropriate for admission to our  unit right now.   DIAGNOSIS, PRINCIPAL AND PRIMARY:   AXIS I: Schizophrenia, undifferentiated.   SECONDARY DIAGNOSES:  AXIS I: Dementia, probably Alzheimer's type rule out chronic developmental disability.   AXIS III: Mild diabetes, dyslipidemia, gastric reflux, glaucoma.     ____________________________ Audery Amel, MD jtc:nt D: 10/01/2014 14:53:39 ET T: 10/01/2014 15:12:11 ET JOB#: [id number]  cc: Audery Amel, MD, <Dictator> Audery Amel MD ELECTRONICALLY SIGNED 11/02/2014 9:55

## 2015-01-27 NOTE — Consult Note (Signed)
Psychiatry: Patient continues without significant lasting change in his symptoms.  Occasionally still slightly animated but has not been violent or aggressive.  One-on-one his thoughts are confused and difficult to follow.  No new physical complaints. question has arisen whether part of this patient's problem is dementia.  I think that is unquestionably true.  Him and his history of developmental disability and schizophrenia it can be hard to differentiate the symptoms but it is extremely likely that Alzheimer's disease or vascular dementia is progressing at his age making his symptoms harder to treat. continues to be inappropriate for admission to our psychiatric unit.  Case discussed with the ER doctor and with social work.  Advised efforts be directed towards possible placement at a dementia or memory unit.  Tolerating the Tegretol and Zyprexa well.  No change to medication.  Electronic Signatures: Audery Amellapacs, Jeremiyah Cullens T (MD)  (Signed on 15-Jan-16 18:45)  Authored  Last Updated: 15-Jan-16 18:45 by Audery Amellapacs, Sulay Brymer T (MD)

## 2015-01-27 NOTE — Consult Note (Signed)
Psychiatry: Spoke with sister today. She remains convinced this is a reversible psychiatric condition and states it is exactly like two prior episode. Patient was asleep when I tried to speak with him today and would not arouse to repeated calling his name and shaking. As I discussed with sister, we will try to change his anti-psychotic to try to get some improvement. DC the zyprexa and start saphris 10mg  SL in the evening. PAtient has been turned down by multiple facilities so far. After changing medication we can try discharging him again to his group home.  Electronic Signatures: Audery Amellapacs, John T (MD)  (Signed on 01-Feb-16 15:31)  Authored  Last Updated: 01-Feb-16 15:31 by Audery Amellapacs, John T (MD)

## 2015-01-27 NOTE — Consult Note (Signed)
PATIENT NAME:  Charles Irwin, Charles Irwin MR#:  [id number] DATE OF BIRTH:  [birth year]  DATE OF CONSULTATION:  10/10/2014  REFERRING PHYSICIAN:   CONSULTING PHYSICIAN:  Charles Amel, MD  IDENTIFYING INFORMATION AND REASON FOR CONSULTATION: A an 81 year old man who was just released from the Emergency Room 2 days ago who returns brought in by his group home.   CHIEF COMPLAINT: "I can't see you."   HISTORY OF PRESENT ILLNESS: Information is currently lacking. The patient is not able to give any history. According to nursing staff, his group home brought him back into the hospital and at the time that he came in, he was yelling and screaming. They told the nursing staff that the patient had been hallucinating and seeing snakes. They took him allegedly to [facility] and were told by the doctor there to bring him back to the Emergency Room. We do not know whether he has been on his prescribed medicine or whether there have been any changes or whether there has been any particular new stress involved. The patient is unable to give any useful history right now.   PAST PSYCHIATRIC HISTORY: Long history of schizophrenia and developmental disability. Has been to our hospital quite a bit recently, first admitted to the medical service and later with a long stay in the Emergency Room awaiting placement. He did not have any behavior problems throughout his whole time in the Emergency Room and was discharged on the 11th. Apparently when he goes back to the group home, there is something that causes him to have behavior problems. No history of suicide attempts or serious violence in the past. Has been maintained on antipsychotics.   FAMILY HISTORY: None identified.   SOCIAL HISTORY: His sister is his closest relative and I believe his guardian. Does have some other distant family involved.   PAST MEDICAL HISTORY: The patient has mild diabetes, glaucoma, high blood pressure, gastric reflux symptoms.   CURRENT MEDICATIONS:  Based on current reconciliation related to his last discharge, aspirin 81 mg a day, lisinopril 5 mg 2 of them once a day in the morning, clonazepam 1 tablet of 1 mg twice a day, glipizide XL 5 mg once a day, Zyrtec 10 mg once a day, Lipitor 20 mg once a day, Zyprexa 20 mg at night, Fosamax 70 mg once a week, Travatan 0.004% ophthalmic solution 1 drop to each eye at night, Prilosec 40 mg a day.   ALLERGIES: No known drug allergies.   REVIEW OF SYSTEMS: The patient does not offer any coherent answer.   MENTAL STATUS EXAMINATION: Slightly disheveled man, looks his stated age, interviewed in the Emergency Room. He was curled up in the fetal position. Tapping his shoulder I could get him to wake up and acknowledge me. He made minimal eye contact. Speech was hard to understand. Told me, I believe, that he could not see me and could not hear me and then put the blanket back over his head. When pressed, he denied that he was seeing things or hearing things. Denied suicidal or homicidal ideation. Would not participate in further cognitive testing.   ASSESSMENT: An 81 year old man with schizophrenia and mental retardation who has suffered a decline over the last couple of months. His behavior seems to be somehow beyond the ability of this group home to manage. In the Emergency Room, he shows no behavior problems warranting any specific treatment but as soon as he goes home they keep bringing him back. I hesitate to say that he is  meeting involuntary commitment criteria as there is no sign that he is really acutely dangerous to himself or others, rather that they just cannot manage him at his group home and so they keep bringing him back here. Family is requesting that he be admitted to a psychiatric hospital for geriatric patients. We have tried this in the past without any success.   TREATMENT PLAN: Continue outpatient medications. Social work will need to be involved talking with the family. We can try a referral  back to an inpatient psychiatric facility again and see if we have any better luck this time.   DIAGNOSIS, PRINCIPAL AND PRIMARY:  AXIS I: Schizophrenia.   SECONDARY DIAGNOSES: AXIS I: No further.  AXIS II: Mild mental retardation.  AXIS III: Diabetes, high blood pressure.    ____________________________ Charles AmelJohn T. Clapacs, MD jtc:at D: 10/10/2014 15:04:32 ET T: 10/10/2014 15:12:08 ET JOB#: [id number]  cc: Charles AmelJohn T. Clapacs, MD, <Dictator> Charles AmelJOHN T CLAPACS MD ELECTRONICALLY SIGNED 11/07/2014 17:18

## 2015-01-27 NOTE — Consult Note (Signed)
PATIENT NAME:  Charles Irwin, Charles Irwin MR#:  [id number] DATE OF BIRTH:  1932-08-03  DATE OF CONSULTATION:  10/21/2014  REFERRING PHYSICIAN:   CONSULTING PHYSICIAN:  Marykate Heuberger K. Joyce Heitman, MD  AGE: [age] years.  SEX: Male.  RACE: African American.  SUBJECTIVE: The [facility]. The patient is an 81 year old African American male with a long history of mental illness such as schizophrenia. The patient is living at the group home. He was brought [facility] on several occasions [facility] stating that he is yelling and screaming. According to information obtained from the chart, the patient was stabilized on Zyprexa 20 mg at bedtime and Klonopin 1 mg p.o. twice a day; however, the patient's medications have been changed to Zyprexa 10 mg at bedtime and Klonopin 0.5 mg twice a day, which is not helping him stay stable and as soon as he goes home he starts yelling and screaming and they are not able to deal with his behavior. The patient is brought back again since he left last night. Evidently, the patient did not get the medications because he could not get them filled because of bad weather and he has not been getting his medications which decompensates him.  MENTAL STATUS: The patient was seen lying in bed. Alert but very confused. He could not even state his name, he could not identify anybody around, he could not even state where he was. He just mumbles to himself and talks  to himself. Further mental status could not be done. Insight and judgment impaired. Impulse control poor.   IMPRESSION: Schizophrenia, chronic paranoid exacerbation.  RECOMMENDATION: Start the patient back on Zyprexa 20 mg at bedtime and Klonopin 1 mg twice a day and observe him. Let the patient stay in the Emergency Room for a few days, like 2 or 3 days, until he is stable enough so that he can go back to his group home where his behavior will be manageable and he will not be yelling and  screaming.   ____________________________ Jannet MantisSurya K. Guss Bundehalla, MD skc:TT D: 10/21/2014 16:01:43 ET T: 10/21/2014 20:00:02 ET JOB#: [id number]  cc: Monika SalkSurya K. Guss Bundehalla, MD, <Dictator> Beau FannySURYA K Jarel Cuadra MD ELECTRONICALLY SIGNED 10/27/2014 12:03

## 2015-01-27 NOTE — Consult Note (Addendum)
PATIENT NAME:  Charles Irwin, Charles Irwin MR#:  [id number] DATE OF BIRTH:  [birth year]  DATE OF CONSULTATION:  01/16/2015  REFERRING PHYSICIAN:    CONSULTING PHYSICIAN:  Audery AmelJohn T. Clapacs, MD  IDENTIFYING INFORMATION AND REASON FOR CONSULTATION:  An 81 year old male with a history of schizophrenia, possible developmental disability, and dementia.  He was brought over here by his group home because he had acutely stated to them that he was seeing snakes.  There is no report that I am being given that he was violent or threatening or trying to harm himself.  On re-evaluation today, the patient is asymptomatic.  He says he is not feeling bad at all.  Denies having any hallucinations.  Denies any hostility.   REVIEW OF SYSTEMS:  The patient denies any pain.  No cardiac, GI or pulmonary complaints.  Denies hallucinations or suicidal or homicidal ideation.   MENTAL STATUS EXAMINATION:  Slightly disheveled older gentleman who looks his stated age, interviewed in a hospital bed.  He is limited in his communication.  Eye contact good. Psychomotor activity slow but within normal range for him.  Speech slow decreased in amount but a little more understandable than yesterday.  Affect is flat, euthymic.  Mood is stated as being fine.  Thoughts are slow and very concrete.  No obvious delusions.  Denies auditory or visual hallucinations.  The patient is not oriented to his situation at all.  Further cognitive testing not pursued at this time.   VITAL SIGNS:  Blood pressures have been stable, currently 137/75, respirations 18, pulse 98, temperature 97.   LABORATORY RESULTS:  Urinalysis unremarkable.  Drug screen negative.  Glucoses running normal to slightly low.   ASSESSMENT:  An 81 year old man with a history of schizophrenia, dementia, probable mild MR, brought over here, from what I can tell, because he just had an acute episode of claiming he was seeing snakes.  He has not made any such claims since being here in the  Emergency Room. He has not been difficult, aggressive, threatening or even uncooperative.  The patient appears to be at his baseline at this point and does not require further psychiatric treatment here in the Emergency Room or hospital treatment.  I recommend that he be discharged back to his group home and continue on current medicine and treatment as previously.   DIAGNOSIS, PRINCIPAL AND PRIMARY:   AXIS I: Schizophrenia.   SECONDARY DIAGNOSES:   AXIS I:   Dementia, vascular and Alzheimer's type mixed diagnosis.   AXIS II:  Mild chronic intellectual disability.  AXIS III:  Hypertension, diabetes, chronic allergies.     ____________________________ Audery AmelJohn T. Clapacs, MD jtc:NT D: 01/16/2015 15:38:21 ET T: 01/16/2015 16:00:34 ET JOB#: [id number]  cc: Audery AmelJohn T. Clapacs, MD, <Dictator> Audery AmelJOHN T CLAPACS MD ELECTRONICALLY SIGNED 02/01/2015 19:28

## 2015-01-27 NOTE — Consult Note (Addendum)
PATIENT NAME:  Charles Irwin, Charles Irwin MR#:  [id number] DATE OF BIRTH:  08-17-1932  DATE OF CONSULTATION:  01/15/2015  CONSULTING PHYSICIAN:  Audery Amel, MD.  IDENTIFYING INFORMATION AND REASON FOR CONSULTATION:  An 81 year old man with a history of schizophrenia who was brought here from his group home and so far we do not have any information at all.  Consultation for seeing snakes and excoriated neck.   HISTORY OF PRESENT ILLNESS:  Minimal history available right now.  Apparently someone told Dr. Dierdre Searles that the patient had been seeing snakes.  The patient does not tell me that.  We do not have any information that I have gotten directly about why he is here in the hospital.  The patient will only say to me that he wants to get out of the hospital right now and does not give   me any history.  He cannot explain why he is here.  Does not admit to having any hallucinations. Denies suicidal thoughts.  He has no idea what medications he is supposed to be taking.   PAST PSYCHIATRIC HISTORY:  Charles Irwin has a lifelong history of schizophrenia and probably developmental disability, probably at least mild intellectual impairment chronically. Over the last few months, he has decompensated.  He spent an extended period of time in our Emergency Room in the past.  His behavior has become more difficult to control and he seems to respond less to medication.  Past history from his sister who is his closest family member is that when he was well, he was quite pleasant and fairly functional on his medicine.  He has not been anything like that that we have seen in the last several months.   SOCIAL HISTORY:  Closest relative is his sister who is extremely supportive and involved.  He lives in a group home.   PAST MEDICAL HISTORY:  Charles Irwin for his age is actually quite healthy.  Minimal known identified other medical history.  Does have a past history of high blood pressure, diabetes, and glaucoma.   FAMILY  HISTORY:  Unknown.   SUBSTANCE ABUSE HISTORY:  No known ongoing substance abuse problems.   CURRENT MEDICATIONS:  Accompanied sheets show clonazepam 0.5 mg in the morning, Senokot 1 tablet once a day, glipizide extended release 5 mg once a day, Zyrtec 10 mg once a day, aspirin 81 mg once a day, Fosamax 35 mg weekly, Colace 100 mg twice a day, olanzapine 60 mg at night, clonazepam also 1 full milligram at night, Haldol 2 mg at night, Pravachol 20 mg once a day, Singulair 10 mg once a day, Xalatan eye drops one drop in each eye in the evening, MiraLax powder daily.   ALLERGIES:  No known drug allergies.   REVIEW OF SYSTEMS:  The patient is not very cooperative as a historian.  He denies any pain, denies hallucinations, denies any symptoms right now.   MENTAL STATUS EXAMINATION:  Elderly gentleman, looks his stated age, who is lying down on the hospital stretcher.  Makes eye contact only when I sit directly in front of him. Psychomotor activity:  Very limited.  He barely moves during the interview.  Speech:  Decreased in total amount and hard to understand.  Affect:  Flat.  The only thing he would say during our interview is to repeatedly say he wanted to leave the hospital right now.  Would not give any other information or directly answer any questions.  Did not indicate that he was having  any hallucinations.  Did not report any suicidal or homicidal ideation.  Unknown whether he is fully oriented.  He does know that he is in the hospital.  Patient not cooperative with further cognitive testing.   LABORATORY DATA:  Urine drug screen is all negative.  Urinalysis is all negative.  Head CT:  Nothing acute.  Just chronic atrophy, same as before.  Salicylates, acetaminophen, and alcohol negative.  Slightly low calcium at 8.0.  Slightly elevated creatinine 1.8.  Slight anemia with a hematocrit of 39.   VITAL SIGNS:  Blood pressure 156/95, respirations 20, pulse 91, temperature 95.   ASSESSMENT:  This is an  81 year old man with a history of schizophrenia and developmental disability.  Apparently someone said that he was seeing snakes.  The patient is not admitting to it now.  Not clear if he really needs hospital level treatment or not.  Right now he seems to be at his baseline.  We need to gather further information.   TREATMENT PLAN:  Continue his outpatient medicines.  We will work on gathering more information from his group home.  Hope that possibly we may be able to send him home tomorrow.  If not, we will have to refer him to geropsychiatry.   DIAGNOSIS, PRINCIPAL AND PRIMARY:  AXIS I:  Schizophrenia.   SECONDARY DIAGNOSES:  AXIS I:  Developmental disability, not otherwise specified; mild mental retardation; dementia of vascular and Alzheimer-type.  AXIS II:  Deferred.  AXIS III:  History of mild diabetes and glaucoma and high blood pressure.      ____________________________ Audery AmelJohn T. Dayson Aboud, MD jtc:kc D: 01/15/2015 19:23:54 ET T: 01/15/2015 19:52:19 ET JOB#: [id number]  cc: Audery AmelJohn T. Rito Lecomte, MD, <Dictator> Audery AmelJOHN T Ollin Hochmuth MD ELECTRONICALLY SIGNED 02/01/2015 19:28

## 2015-05-01 LAB — TSH: Thyroid Stimulating Horm: 0.768 u[IU]/mL

## 2015-05-01 LAB — SALICYLATE LEVEL: Salicylates, Serum: <4 mg/dL

## 2015-05-01 LAB — ACETAMINOPHEN LEVEL: Acetaminophen: <10 ug/mL

## 2015-05-01 LAB — ETHANOL

## 2015-06-11 IMAGING — CT CT HEAD WITHOUT CONTRAST
2 series · 16 of 30 positions shown, 20 images · non-contrast
Comparison: 10/01/2014

CLINICAL DATA: Unresponsive and nonverbal.

EXAM:
CT HEAD WITHOUT CONTRAST
TECHNIQUE: Contiguous axial images were obtained from the base of the skull
through the vertex without intravenous contrast.

[Series 2: soft tissue · axial · 0.44mm/px · z∈[-268,-148]mm · 13 of 30 slices shown, 17 images]
[im 3/30  brain]
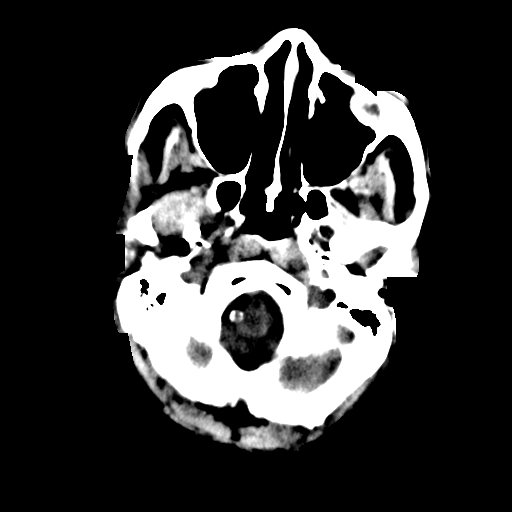
[im 3/30  bone]
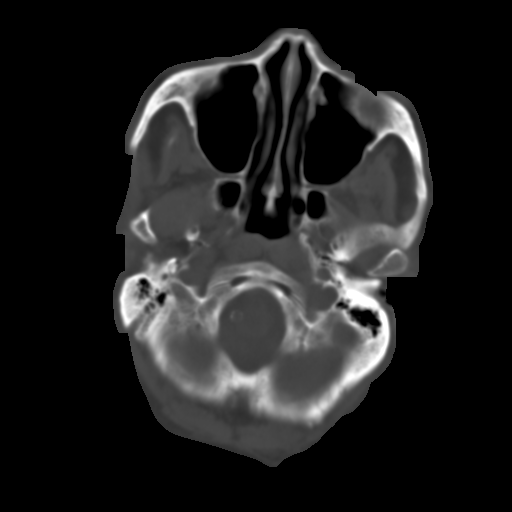
[im 5/30  brain]
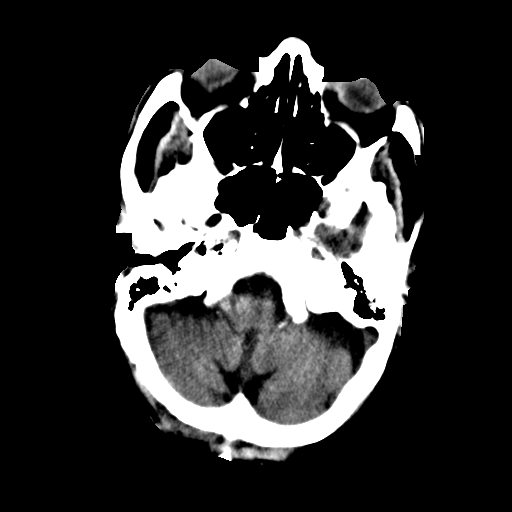
[im 7/30  brain]
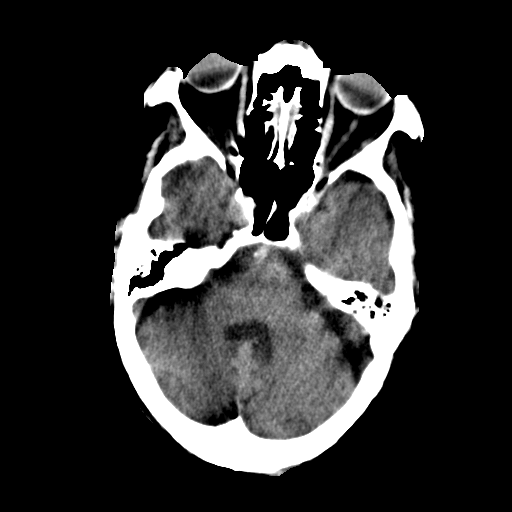
[im 9/30  brain]
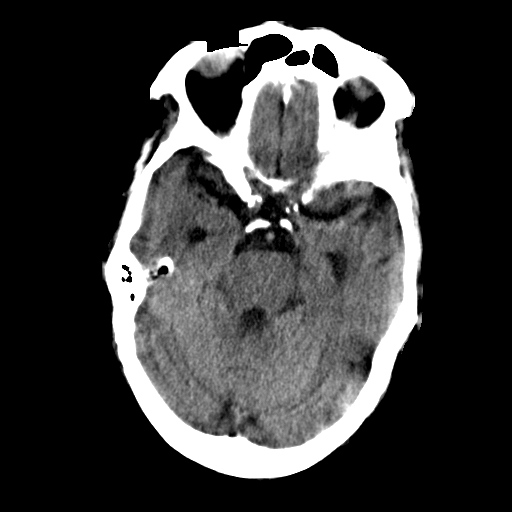
[im 11/30  brain]
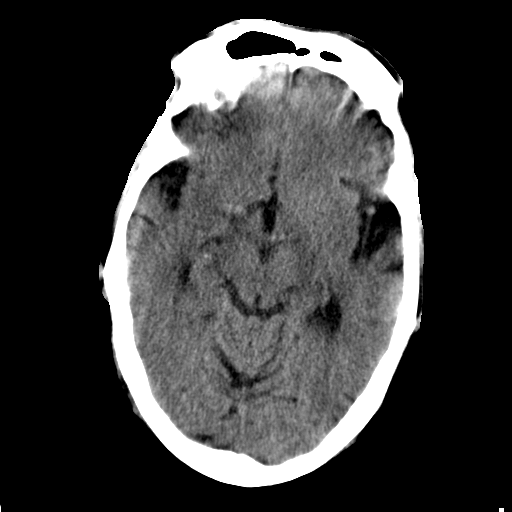
[im 11/30  bone]
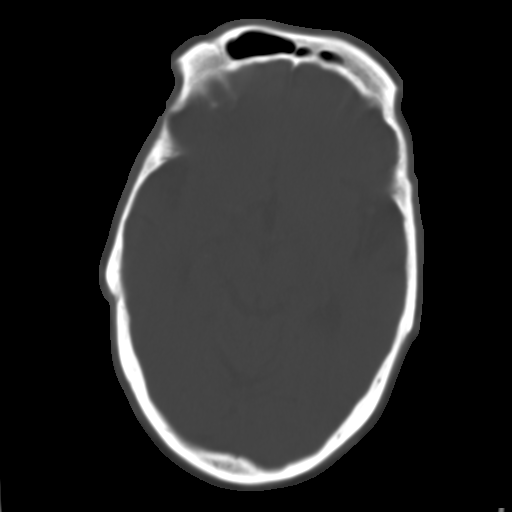
[im 13/30  brain]
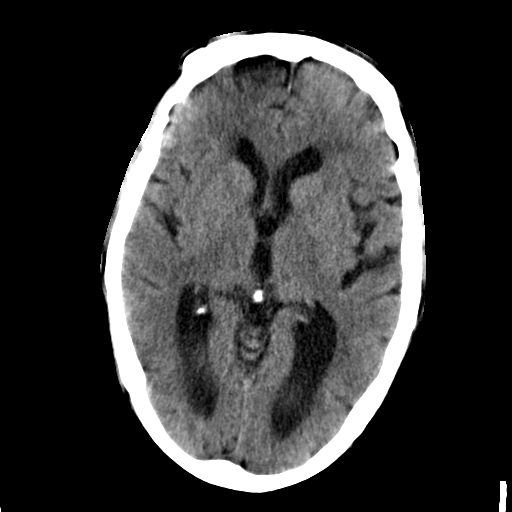
[im 15/30  brain]
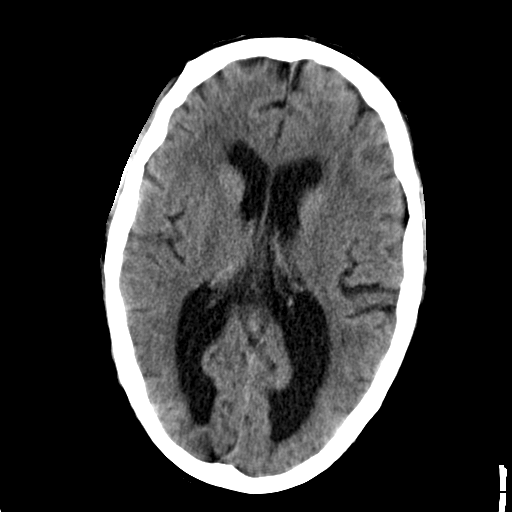
[im 17/30  brain]
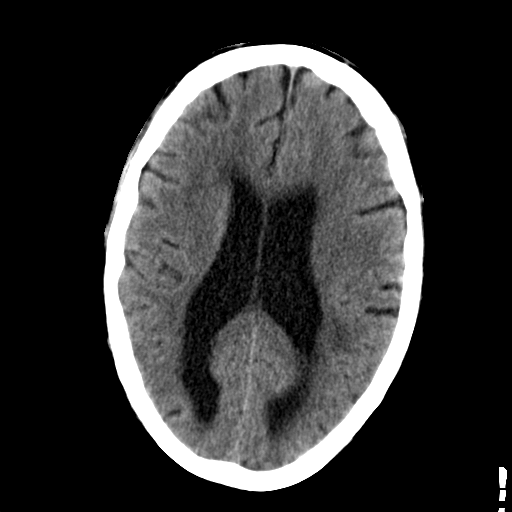
[im 19/30  brain]
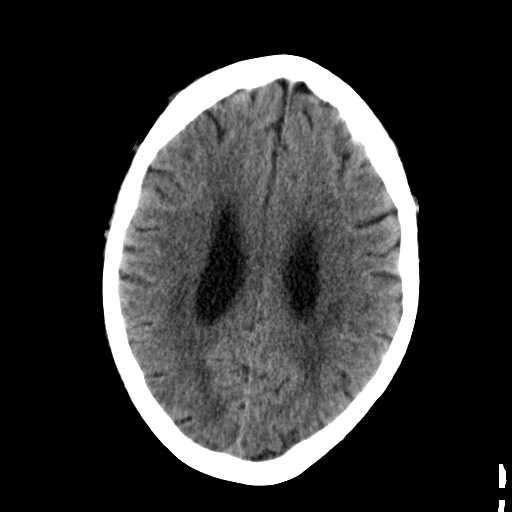
[im 19/30  bone]
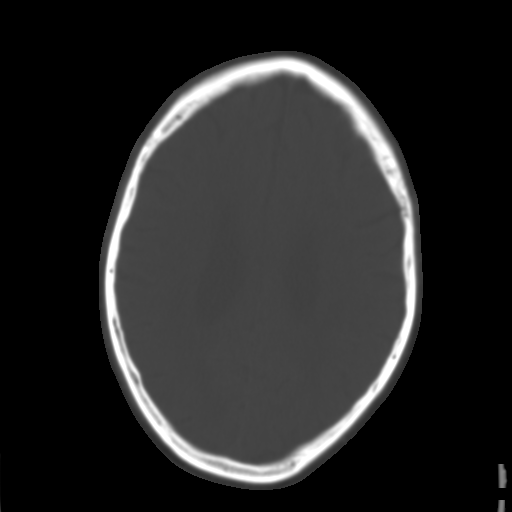
[im 21/30  brain]
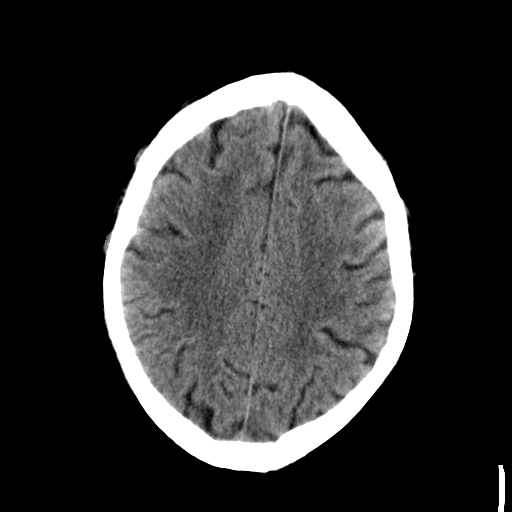
[im 23/30  brain]
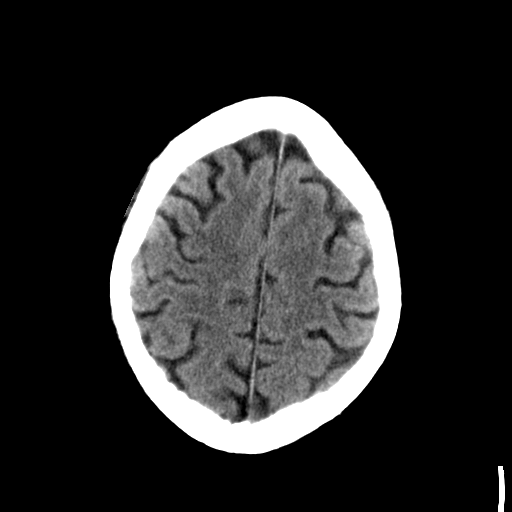
[im 25/30  brain]
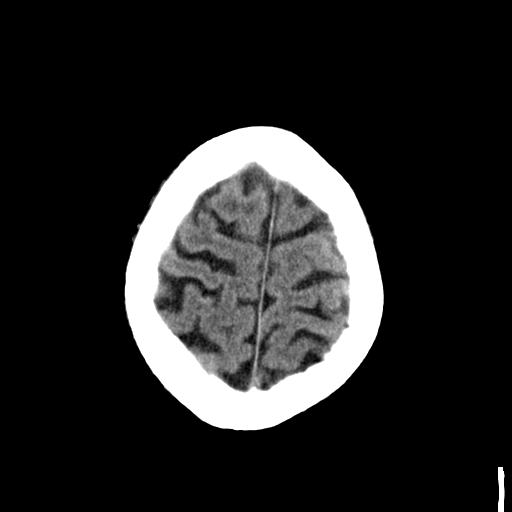
[im 27/30  brain]
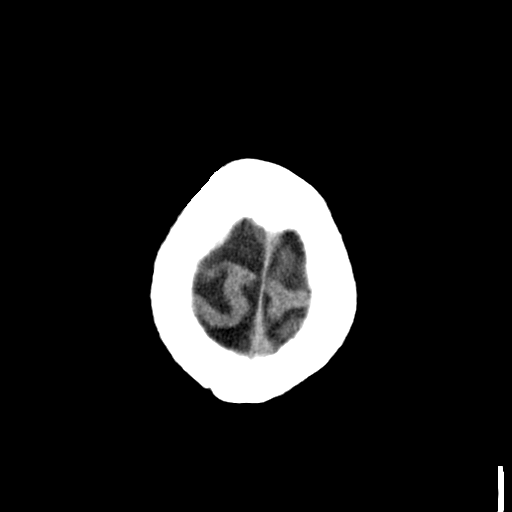
[im 27/30  bone]
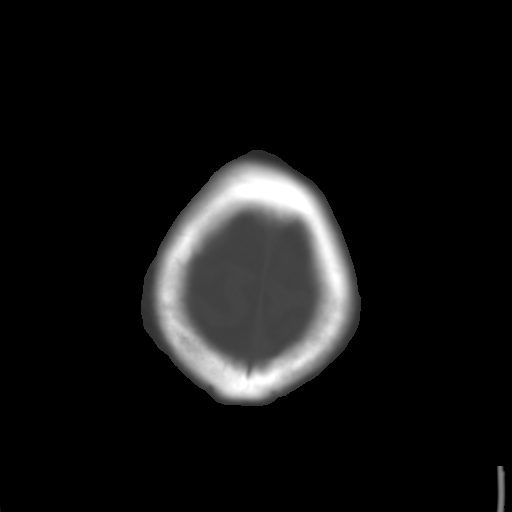

[Series 4: soft tissue recon · axial · 0.42mm/px · z∈[-250,-211]mm · 3 of 30 slices shown]
[im 3/30  brain]
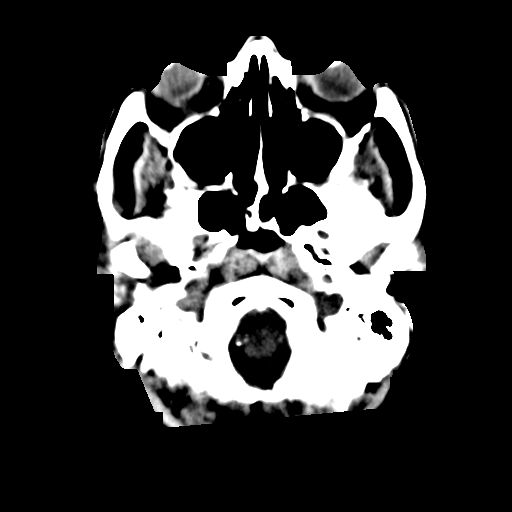
[im 7/30  brain]
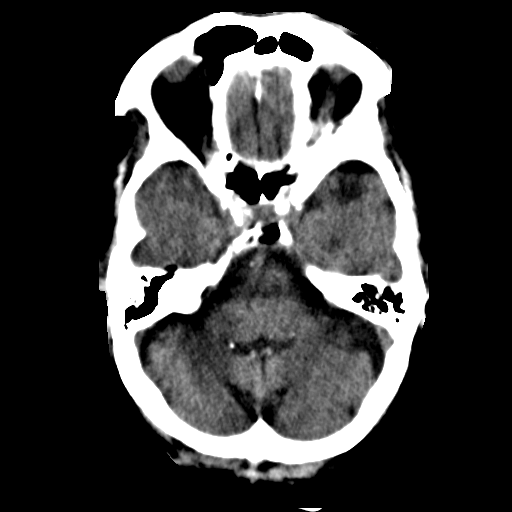
[im 11/30  brain]
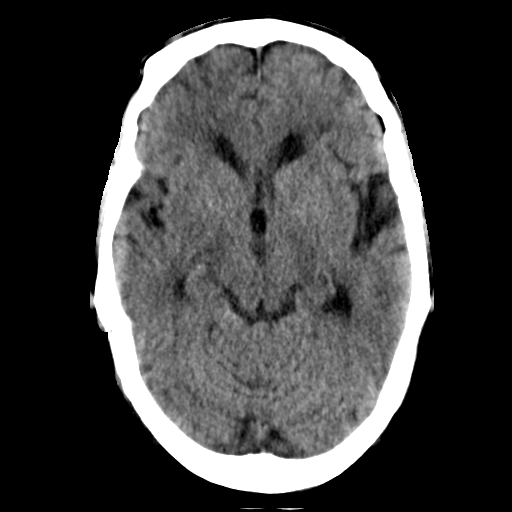

[16 of 30 positions shown; findings below may reference images not displayed]

FINDINGS: Stable cortical atrophy and small vessel disease in the
periventricular white matter. The brain demonstrates no evidence of
hemorrhage, infarction, edema, mass effect, extra-axial fluid
collection, hydrocephalus or mass lesion. The skull is unremarkable.
IMPRESSION: No acute findings. Stable cortical atrophy and small vessel disease.

## 2015-06-17 IMAGING — CR PELVIS - 1-2 VIEW
1 series · 1 of 1 positions shown · non-contrast
Comparison: None.

CLINICAL DATA: Patient fell onto the left side on the floor while
at Hospital. Mixed catatonic and combative states.

EXAM:
PELVIS - 1-2 VIEW

[dxr pelvis ap only]
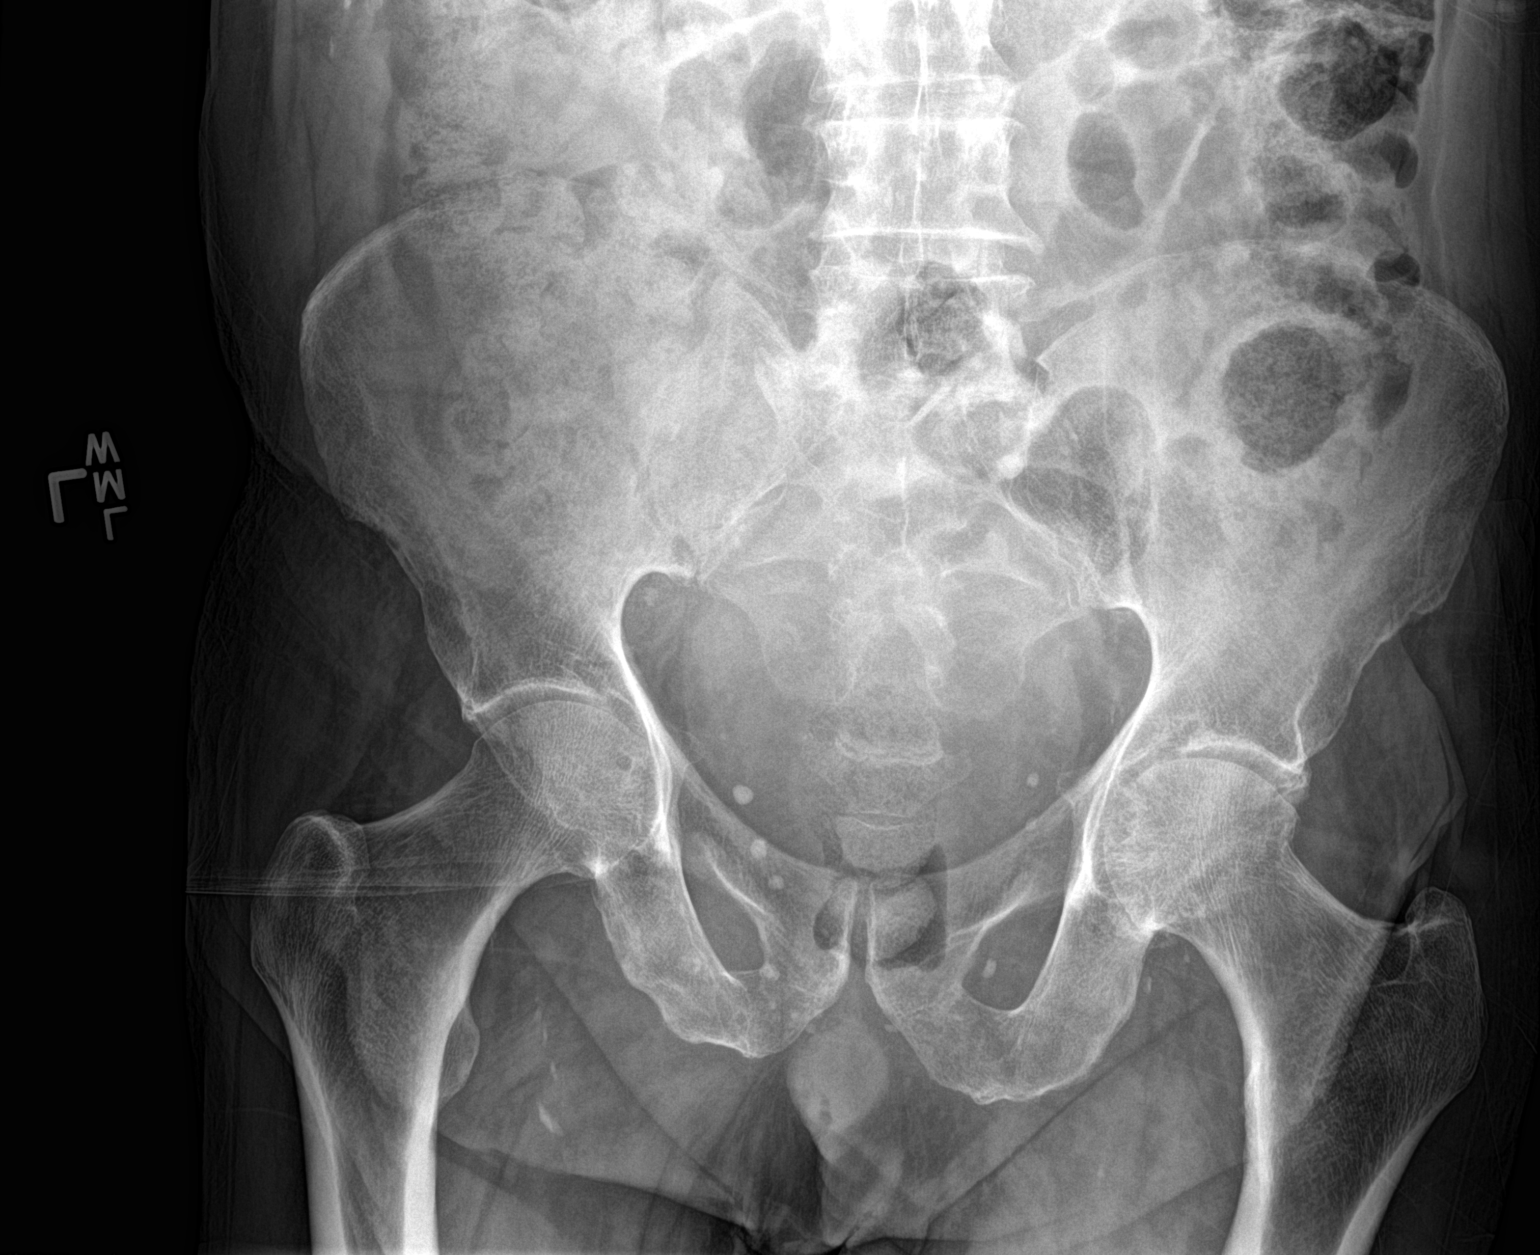

[1 of 1 positions shown; findings below may reference images not displayed]

FINDINGS: Degenerative changes in the lower lumbar spine and both hips,
greater in the left hip. Pelvis and hips appear intact. No displaced
acute fractures are identified. SI joints and symphysis pubis are
not displaced. Vascular calcifications.
IMPRESSION: Degenerative changes in the lower lumbar spine and hips. No acute
bony abnormalities.

## 2017-10-11 ENCOUNTER — Emergency Department (HOSPITAL_COMMUNITY): Payer: Medicare Other

## 2017-10-11 ENCOUNTER — Other Ambulatory Visit: Payer: Self-pay

## 2017-10-11 ENCOUNTER — Encounter (HOSPITAL_COMMUNITY): Payer: Self-pay | Admitting: *Deleted

## 2017-10-11 ENCOUNTER — Emergency Department (HOSPITAL_COMMUNITY)
Admission: EM | Admit: 2017-10-11 | Discharge: 2017-10-11 | Disposition: A | Discharge status: Home / Self Care | Payer: Medicare Other | Attending: Emergency Medicine | Admitting: Emergency Medicine

## 2017-10-11 DIAGNOSIS — S299XXA Unspecified injury of thorax, initial encounter: Secondary | ICD-10-CM | POA: Diagnosis present

## 2017-10-11 DIAGNOSIS — W19XXXA Unspecified fall, initial encounter: Secondary | ICD-10-CM | POA: Diagnosis not present

## 2017-10-11 DIAGNOSIS — Y92092 Bedroom in other non-institutional residence as the place of occurrence of the external cause: Secondary | ICD-10-CM | POA: Diagnosis not present

## 2017-10-11 DIAGNOSIS — Z79899 Other long term (current) drug therapy: Secondary | ICD-10-CM | POA: Diagnosis not present

## 2017-10-11 DIAGNOSIS — G309 Alzheimer's disease, unspecified: Secondary | ICD-10-CM | POA: Insufficient documentation

## 2017-10-11 DIAGNOSIS — I10 Essential (primary) hypertension: Secondary | ICD-10-CM | POA: Diagnosis not present

## 2017-10-11 DIAGNOSIS — Z7984 Long term (current) use of oral hypoglycemic drugs: Secondary | ICD-10-CM | POA: Insufficient documentation

## 2017-10-11 DIAGNOSIS — E119 Type 2 diabetes mellitus without complications: Secondary | ICD-10-CM | POA: Diagnosis not present

## 2017-10-11 DIAGNOSIS — Y999 Unspecified external cause status: Secondary | ICD-10-CM | POA: Insufficient documentation

## 2017-10-11 DIAGNOSIS — S2241XA Multiple fractures of ribs, right side, initial encounter for closed fracture: Secondary | ICD-10-CM | POA: Diagnosis not present

## 2017-10-11 DIAGNOSIS — Y939 Activity, unspecified: Secondary | ICD-10-CM | POA: Diagnosis not present

## 2017-10-11 HISTORY — DX: Essential (primary) hypertension: I10

## 2017-10-11 HISTORY — DX: Alzheimer's disease, unspecified: G30.9

## 2017-10-11 HISTORY — DX: Schizophrenia, unspecified: F20.9

## 2017-10-11 HISTORY — DX: Dementia in other diseases classified elsewhere, unspecified severity, without behavioral disturbance, psychotic disturbance, mood disturbance, and anxiety: F02.80

## 2017-10-11 MED ORDER — TRAMADOL HCL 50 MG PO TABS: 50.0000 mg | ORAL_TABLET | Freq: Two times a day (BID) | ORAL | 0 refills | Status: AC | PRN

## 2017-10-11 NOTE — ED Notes (Signed)
Pt to Xray.

## 2017-10-11 NOTE — ED Notes (Signed)
Called [facility] to let them know pt was being discharged back to their facility and to go over his discharge instructions with them.

## 2017-10-11 NOTE — ED Notes (Signed)
Pt back from X-ray.  

## 2017-10-11 NOTE — ED Notes (Signed)
Writer spoke to [name] and informed her patient is on the way [facility].

## 2017-10-11 NOTE — ED Notes (Signed)
[name]

## 2017-10-11 NOTE — ED Provider Notes (Signed)
[facility]ERGENCY DEPARTMENT Provider Note   CSN: [id number] Arrival date & time: 10/11/17  0413     History   Chief Complaint Chief Complaint  Patient presents with  . Fall    HPI Osborn CohoJaycee Perlie Irwin is a [age] y.o. male.  Patient brought to the emergency department for evaluation after a fall.  Patient had an unwitnessed fall, was found on the floor next to his bed.  His roommate reports that he had a fell, but circumstances are unclear.  Patient noted to have a large area of swelling over his right eye, does not remember falling.  He has a history of MR, cannot provide any further information. Level V Caveat due to MR.      Past Medical History:  Diagnosis Date  . Alzheimer's disease   . Hypertension   . Schizophrenia (HCC)     There are no active problems to display for this patient.       Home Medications    Prior to Admission medications   Medication Sig Start Date End Date Taking? Authorizing Provider  acetaminophen (TYLENOL) 500 MG tablet Take 500 mg by mouth every 6 (six) hours as needed.   Yes [provider]  alendronate (FOSAMAX) 70 MG tablet Take 70 mg by mouth once a week. Take with a full glass of water on an empty stomach.   Yes [provider]  alum & mag hydroxide-simeth (MINTOX PLUS) 200-200-25 MG chewable tablet Chew 1 tablet by mouth every 6 (six) hours as needed for indigestion or heartburn.   Yes [provider]  aspirin 81 MG chewable tablet Chew by mouth daily.   Yes [provider]  clonazePAM (KLONOPIN) 0.5 MG tablet Take 0.5 mg by mouth 2 (two) times daily as needed for anxiety.   Yes [provider]  docusate sodium (COLACE) 100 MG capsule Take 100 mg by mouth 2 (two) times daily.   Yes [provider]  glipiZIDE (GLUCOTROL XL) 5 MG 24 hr tablet Take 5 mg by mouth daily with breakfast.   Yes [provider]  guaifenesin (ROBITUSSIN) 100 MG/5ML syrup Take 200 mg by mouth 3 (three)  times daily as needed for cough.   Yes [provider]  haloperidol (HALDOL) 5 MG tablet Take 5 mg by mouth at bedtime.   Yes [provider]  latanoprost (XALATAN) 0.005 % ophthalmic solution 1 drop at bedtime.   Yes [provider]  loperamide (IMODIUM) 2 MG capsule Take 2 mg by mouth as needed for diarrhea or loose stools.   Yes [provider]  magnesium hydroxide (MILK OF MAGNESIA) 400 MG/5ML suspension Take 30 mLs by mouth daily as needed for mild constipation.   Yes [provider]  montelukast (SINGULAIR) 10 MG tablet Take 10 mg by mouth at bedtime.   Yes [provider]  Multiple Vitamin (DAILY VITE PO) Take 1 tablet by mouth daily.   Yes [provider]  OLANZapine (ZYPREXA) 2.5 MG tablet Take 2.5 mg by mouth 2 (two) times daily.   Yes [provider]  polyethylene glycol (MIRALAX / GLYCOLAX) packet Take 17 g by mouth daily.   Yes [provider]  rivastigmine (EXELON) 4.6 mg/24hr Place 4.6 mg onto the skin daily.   Yes [provider]  senna (SENOKOT) 8.6 MG TABS tablet Take 1 tablet by mouth daily.   Yes [provider]  Vitamin D, Ergocalciferol, 2000 units CAPS Take 1 capsule by mouth daily.   Yes  [provider]  traMADol (ULTRAM) 50 MG tablet Take 1 tablet (50 mg total) by mouth every 12 (twelve) hours as needed for moderate pain. 10/11/17   [name], MD    Family History No family history on file.  Social History Social History   Tobacco Use  . Smoking status: Not on file  Substance Use Topics  . Alcohol use: Not on file  . Drug use: Not on file     Allergies   Patient has no known allergies.   Review of Systems Review of Systems  Unable to perform ROS: Patient nonverbal     Physical Exam Updated Vital Signs There were no vitals taken for this visit.  Physical Exam  Constitutional: He appears well-developed and well-nourished. No  distress.  HENT:  Head: Normocephalic. Head is with contusion (R eyebrow).  Right Ear: Hearing normal.  Left Ear: Hearing normal.  Nose: Nose normal.  Mouth/Throat: Oropharynx is clear and moist and mucous membranes are normal.  Eyes: Conjunctivae and EOM are normal. Pupils are equal, round, and reactive to light.  Neck: Normal range of motion. Neck supple.  Cardiovascular: Regular rhythm, S1 normal and S2 normal. Exam reveals no gallop and no friction rub.  No murmur heard. Pulmonary/Chest: Effort normal and breath sounds normal. No respiratory distress. He exhibits no tenderness.  Abdominal: Soft. Normal appearance and bowel sounds are normal. There is no hepatosplenomegaly. There is no tenderness. There is no rebound, no guarding, no tenderness at McBurney's point and negative Murphy's sign. No hernia.  Musculoskeletal: Normal range of motion.  Chronic contractures of hands, wrists and elbows bilaterally, no obvious deformity.  Normal range of motion bilateral hips.  Neurological: He is alert. He has normal strength. No cranial nerve deficit or sensory deficit. Coordination normal. GCS eye subscore is 4. GCS verbal subscore is 5. GCS motor subscore is 6.  Skin: Skin is warm, dry and intact. No rash noted. No cyanosis.  Nursing note and vitals reviewed.    ED Treatments / Results  Labs (all labs ordered are listed, but only abnormal results are displayed) Labs Reviewed - No data to display  EKG  EKG Interpretation None       Radiology Dg Chest 1 View  Result Date: 10/11/2017 CLINICAL DATA:  Fall from bed. EXAM: CHEST 1 VIEW COMPARISON:  Chest radiograph January 15, 2015 FINDINGS: Acute displaced RIGHT ninth rib fracture possible nondisplaced RIGHT eighth and seventh rib fractures. Old LEFT rib fractures. Cardiomediastinal silhouette is normal, calcified aortic knob. Chronic interstitial changes with bibasilar fibrosis. No pleural effusion or focal consolidation. No pneumothorax.  IMPRESSION: Acute displaced RIGHT ninth rib fracture. Possible nondisplaced RIGHT seventh and eighth rib fractures. No acute cardiopulmonary process. Aortic Atherosclerosis (ICD10-I70.0) and Emphysema (ICD10-J43.9). Electronically Signed   By: Awilda Metro M.D.   On: 10/11/2017 05:34   Dg Pelvis 1-2 Views  Result Date: 10/11/2017 CLINICAL DATA:  Fall from bed. EXAM: PELVIS - 1-2 VIEW COMPARISON:  Pelvic radiograph January 29th 2,016 FINDINGS: There is no evidence of pelvic fracture or diastasis. No pelvic bone lesions are seen. Multiple phleboliths project in the pelvis. Mild vascular calcifications. IMPRESSION: No acute fracture deformity or dislocation. Electronically Signed   By: Awilda Metro M.D.   On: 10/11/2017 05:35   Ct Head Wo Contrast  Result Date: 10/11/2017 CLINICAL DATA:  Larey Seat from bed, RIGHT forehead hematoma. Poor historian. EXAM: CT HEAD WITHOUT CONTRAST CT CERVICAL SPINE WITHOUT CONTRAST TECHNIQUE: Multidetector CT imaging of the head and cervical  spine was performed following the standard protocol without intravenous contrast. Multiplanar CT image reconstructions of the cervical spine were also generated. COMPARISON:  CT HEAD January 23, 2015 FINDINGS: CT HEAD FINDINGS-mildly motion degraded examination. BRAIN: No intraparenchymal hemorrhage, mass effect nor midline shift. Moderate to severe parenchymal brain volume loss, disproportionately affecting the parietoccipital lobes. No hydrocephalus. Patchy supratentorial white matter hypodensities within normal range for patient's age, though non-specific are most compatible with chronic small vessel ischemic disease. No acute large vascular territory infarcts. No abnormal extra-axial fluid collections. Basal cisterns are patent. VASCULAR: Moderate calcific atherosclerosis of the carotid siphons. SKULL: No skull fracture. Moderate RIGHT frontal scalp hematoma without subcutaneous gas or radiopaque foreign bodies. SINUSES/ORBITS: The  mastoid air-cells and included paranasal sinuses are well-aerated.The included ocular globes and orbital contents are non-suspicious. OTHER: None. CT CERVICAL SPINE FINDINGS-moderately motion degraded examination. ALIGNMENT: Maintained lordosis. Vertebral bodies in alignment. Dextroscoliosis may be positional. SKULL BASE AND VERTEBRAE: Cervical vertebral bodies and posterior elements are intact. Intervertebral disc heights preserved. No destructive bony lesions. C1-2 articulation maintained. SOFT TISSUES AND SPINAL CANAL: Nonacute. DISC LEVELS: No significant osseous canal stenosis. Moderate LEFT C3-4, RIGHT C4-5 and bilateral C5-6 neural foraminal narrowing. UPPER CHEST: Apical bullous changes. OTHER: None. IMPRESSION: CT HEAD: 1. Mild motion degraded examination. No acute intracranial process. Moderate RIGHT frontal scalp hematoma. No skull fracture. 2. Stable examination including disproportionate parietoccipital lobe atrophy associated with neuro degenerative syndrome posterior cortical atrophy. CT CERVICAL SPINE: 1. No acute fracture or malalignment on this motion degraded examination. 2. Moderate C3-4 through C5-6 neural foraminal narrowing. Electronically Signed   By: Awilda Metro M.D.   On: 10/11/2017 05:27   Ct Cervical Spine Wo Contrast  Result Date: 10/11/2017 CLINICAL DATA:  Larey Seat from bed, RIGHT forehead hematoma. Poor historian. EXAM: CT HEAD WITHOUT CONTRAST CT CERVICAL SPINE WITHOUT CONTRAST TECHNIQUE: Multidetector CT imaging of the head and cervical spine was performed following the standard protocol without intravenous contrast. Multiplanar CT image reconstructions of the cervical spine were also generated. COMPARISON:  CT HEAD January 23, 2015 FINDINGS: CT HEAD FINDINGS-mildly motion degraded examination. BRAIN: No intraparenchymal hemorrhage, mass effect nor midline shift. Moderate to severe parenchymal brain volume loss, disproportionately affecting the parietoccipital lobes. No  hydrocephalus. Patchy supratentorial white matter hypodensities within normal range for patient's age, though non-specific are most compatible with chronic small vessel ischemic disease. No acute large vascular territory infarcts. No abnormal extra-axial fluid collections. Basal cisterns are patent. VASCULAR: Moderate calcific atherosclerosis of the carotid siphons. SKULL: No skull fracture. Moderate RIGHT frontal scalp hematoma without subcutaneous gas or radiopaque foreign bodies. SINUSES/ORBITS: The mastoid air-cells and included paranasal sinuses are well-aerated.The included ocular globes and orbital contents are non-suspicious. OTHER: None. CT CERVICAL SPINE FINDINGS-moderately motion degraded examination. ALIGNMENT: Maintained lordosis. Vertebral bodies in alignment. Dextroscoliosis may be positional. SKULL BASE AND VERTEBRAE: Cervical vertebral bodies and posterior elements are intact. Intervertebral disc heights preserved. No destructive bony lesions. C1-2 articulation maintained. SOFT TISSUES AND SPINAL CANAL: Nonacute. DISC LEVELS: No significant osseous canal stenosis. Moderate LEFT C3-4, RIGHT C4-5 and bilateral C5-6 neural foraminal narrowing. UPPER CHEST: Apical bullous changes. OTHER: None. IMPRESSION: CT HEAD: 1. Mild motion degraded examination. No acute intracranial process. Moderate RIGHT frontal scalp hematoma. No skull fracture. 2. Stable examination including disproportionate parietoccipital lobe atrophy associated with neuro degenerative syndrome posterior cortical atrophy. CT CERVICAL SPINE: 1. No acute fracture or malalignment on this motion degraded examination. 2. Moderate C3-4 through C5-6 neural foraminal narrowing. Electronically Signed   By:  Awilda Metro M.D.   On: 10/11/2017 05:27    Procedures Procedures (including critical care time)  Medications Ordered in ED Medications - No data to display   Initial Impression / Assessment and Plan / ED Course  I have reviewed  the triage vital signs and the nursing notes.  Pertinent labs & imaging results that were available during my care of the patient were reviewed by me and considered in my medical decision making (see chart for details).     Patient brought to the emergency department for evaluation after a fall.  Fall was unwitnessed, circumstances unclear.  Patient has a history of MR, cannot provide any further information.  Patient did have an obvious hematoma of the right eyebrow.  CT head and cervical spine did not show any acute abnormality.  Patient denied any other areas of tenderness, empirically had x-ray of chest and pelvis performed.  Chest x-ray does show several rib fractures on the right side, no pulmonary contusion or pneumothorax.  Pelvis was negative, patient is moving both lower extremities without difficulty, no concern for hip injury.  Patient is not expensing any pain currently, vital signs are stable, no difficulty with breathing.  He is appropriate for discharge and outpatient management of rib fractures.  Final Clinical Impressions(s) / ED Diagnoses   Final diagnoses:  Closed fracture of multiple ribs of right side, initial encounter    ED Discharge Orders        Ordered    traMADol (ULTRAM) 50 MG tablet  Every 12 hours PRN     10/11/17 0545       [name], MD 10/11/17 [phone]

## 2017-10-11 NOTE — ED Triage Notes (Signed)
Pt brought by [facility] for c/o fall; pt has large hematoma to right side of head; pt is nonverbal, which is his normal state

## 2017-10-11 NOTE — ED Notes (Signed)
[facility].  A truck should be on the way.

## 2017-11-26 DEATH — deceased
